# Patient Record
Sex: Male | Born: 1985 | Race: Black or African American | Hispanic: No | Marital: Single | Smoking: Current every day smoker
Health system: Southern US, Community
[De-identification: ages and names within clinical notes are randomized; demographics above are authoritative.]

## PROBLEM LIST (undated history)

## (undated) DIAGNOSIS — I10 Essential (primary) hypertension: Secondary | ICD-10-CM

---

## 2005-09-30 ENCOUNTER — Emergency Department (HOSPITAL_COMMUNITY): Admission: EM | Admit: 2005-09-30 | Discharge: 2005-09-30 | Payer: Self-pay | Admitting: Emergency Medicine

## 2009-06-30 ENCOUNTER — Emergency Department (HOSPITAL_COMMUNITY): Admission: EM | Admit: 2009-06-30 | Discharge: 2009-06-30 | Payer: Self-pay | Admitting: Emergency Medicine

## 2011-01-03 LAB — URINALYSIS, ROUTINE W REFLEX MICROSCOPIC
Bilirubin Urine: NEGATIVE
Glucose, UA: NEGATIVE mg/dL
Hgb urine dipstick: NEGATIVE
Specific Gravity, Urine: 1.021 (ref 1.005–1.030)
pH: 7.5 (ref 5.0–8.0)

## 2011-01-03 LAB — POCT I-STAT, CHEM 8
Chloride: 104 mEq/L (ref 96–112)
Glucose, Bld: 93 mg/dL (ref 70–99)
Hemoglobin: 17 g/dL (ref 13.0–17.0)
Potassium: 3.9 mEq/L (ref 3.5–5.1)
TCO2: 27 mmol/L (ref 0–100)

## 2011-01-03 LAB — CK TOTAL AND CKMB (NOT AT ARMC)
CK, MB: 1.3 ng/mL (ref 0.3–4.0)
Relative Index: 0.4 (ref 0.0–2.5)
Total CK: 311 U/L — ABNORMAL HIGH (ref 7–232)

## 2011-01-03 LAB — URINE MICROSCOPIC-ADD ON

## 2014-03-22 ENCOUNTER — Encounter (HOSPITAL_COMMUNITY): Payer: Self-pay | Admitting: Emergency Medicine

## 2014-03-22 ENCOUNTER — Other Ambulatory Visit: Payer: Self-pay

## 2014-03-22 ENCOUNTER — Emergency Department (HOSPITAL_COMMUNITY)
Admission: EM | Admit: 2014-03-22 | Discharge: 2014-03-22 | Disposition: A | Discharge status: Home / Self Care | Attending: Emergency Medicine | Admitting: Emergency Medicine

## 2014-03-22 ENCOUNTER — Emergency Department (HOSPITAL_COMMUNITY)

## 2014-03-22 DIAGNOSIS — R071 Chest pain on breathing: Secondary | ICD-10-CM | POA: Insufficient documentation

## 2014-03-22 DIAGNOSIS — I10 Essential (primary) hypertension: Secondary | ICD-10-CM | POA: Insufficient documentation

## 2014-03-22 DIAGNOSIS — R0789 Other chest pain: Secondary | ICD-10-CM

## 2014-03-22 DIAGNOSIS — R079 Chest pain, unspecified: Secondary | ICD-10-CM | POA: Diagnosis present

## 2014-03-22 HISTORY — DX: Essential (primary) hypertension: I10

## 2014-03-22 LAB — BASIC METABOLIC PANEL
BUN: 12 mg/dL (ref 6–23)
CHLORIDE: 104 meq/L (ref 96–112)
CO2: 29 meq/L (ref 19–32)
CREATININE: 1.23 mg/dL (ref 0.50–1.35)
Calcium: 9.5 mg/dL (ref 8.4–10.5)
GFR calc Af Amer: >90 mL/min (ref >90)
GFR calc non Af Amer: 78 mL/min — ABNORMAL LOW (ref >90)
Glucose, Bld: 101 mg/dL — ABNORMAL HIGH (ref 70–99)
Potassium: 4.2 mEq/L (ref 3.7–5.3)
Sodium: 143 mEq/L (ref 137–147)

## 2014-03-22 LAB — CBC
HCT: 42.7 % (ref 39.0–52.0)
HEMOGLOBIN: 14.5 g/dL (ref 13.0–17.0)
MCH: 27.5 pg (ref 26.0–34.0)
MCHC: 34 g/dL (ref 30.0–36.0)
MCV: 81 fL (ref 78.0–100.0)
Platelets: 270 10*3/uL (ref 150–400)
RBC: 5.27 MIL/uL (ref 4.22–5.81)
RDW: 13 % (ref 11.5–15.5)
WBC: 6.1 10*3/uL (ref 4.0–10.5)

## 2014-03-22 LAB — RAPID URINE DRUG SCREEN, HOSP PERFORMED
Amphetamines: NOT DETECTED
BARBITURATES: NOT DETECTED
BENZODIAZEPINES: NOT DETECTED
COCAINE: NOT DETECTED
OPIATES: NOT DETECTED
TETRAHYDROCANNABINOL: NOT DETECTED

## 2014-03-22 LAB — TROPONIN I: Troponin I: <0.3 ng/mL (ref <0.30)

## 2014-03-22 LAB — D-DIMER, QUANTITATIVE (NOT AT ARMC)

## 2014-03-22 MED ORDER — ACETAMINOPHEN 500 MG PO TABS
1000.0000 mg | ORAL_TABLET | Freq: Once | ORAL | Status: AC
  Administered 2014-03-22: 1000 mg via ORAL
  Filled 2014-03-22: qty 2

## 2014-03-22 MED ORDER — IBUPROFEN 600 MG PO TABS: 600.0000 mg | ORAL_TABLET | Freq: Four times a day (QID) | ORAL | Status: DC | PRN

## 2014-03-22 MED ORDER — KETOROLAC TROMETHAMINE 30 MG/ML IJ SOLN
30.0000 mg | Freq: Once | INTRAMUSCULAR | Status: AC
  Administered 2014-03-22: 30 mg via INTRAVENOUS
  Filled 2014-03-22: qty 1

## 2014-03-22 NOTE — ED Notes (Signed)
Patient transported to X-ray 

## 2014-03-22 NOTE — ED Notes (Signed)
Pt given urinal to obtain specimen.

## 2014-03-22 NOTE — ED Provider Notes (Signed)
CSN: [id number]     Arrival date & time 03/22/14  0750 History   First MD Initiated Contact with Patient 03/22/14 0801     Chief Complaint  Patient presents with  . Chest Pain     (Consider location/radiation/quality/duration/timing/severity/associated sxs/prior Treatment) HPI Comments: Bruce Becker is a 28 y.o. Male [facility] presenting with sudden onset of right sided chest pain which is worsened with palpation and deep inspiration.  It has been persistent all night and improved "a little" after receiving ASA and ntg per ems prior to arrival, although now endorses headache.  He was watching tv when the pain began.  He denies nausea, vomiting, sob, diaphoresis.  He has not had cough or hemoptysis, no recent prolonged periods of inactivity and denies pain or swelling in his legs.  He denies cigarette use, substance abuse and family history is negative for cardiac history at early age.  His father has had problems with blood clots; he believes it started with an injury, but had multiple problems with these and has been on blood thinners for a long time.       The history is provided by the patient.    Past Medical History  Diagnosis Date  . Hypertension    History reviewed. No pertinent past surgical history. No family history on file. History  Substance Use Topics  . Smoking status: Never Smoker   . Smokeless tobacco: Not on file  . Alcohol Use: No    Review of Systems  Constitutional: Negative for fever.  HENT: Negative for congestion and sore throat.   Eyes: Negative.   Respiratory: Negative for cough, chest tightness, shortness of breath, wheezing and stridor.   Cardiovascular: Positive for chest pain. Negative for leg swelling.  Gastrointestinal: Negative for nausea and abdominal pain.  Genitourinary: Negative.   Musculoskeletal: Negative for arthralgias, joint swelling and neck pain.  Skin: Negative.  Negative for rash and wound.   Neurological: Negative for dizziness, weakness, light-headedness, numbness and headaches.  Psychiatric/Behavioral: Negative.       Allergies  Review of patient's allergies indicates no known allergies.  Home Medications   Prior to Admission medications   Medication Sig Start Date End Date Taking? Authorizing Provider  ibuprofen (ADVIL,MOTRIN) 600 MG tablet Take 1 tablet (600 mg total) by mouth every 6 (six) hours as needed for moderate pain. 03/22/14   Burgess AmorJulie Idol, PA-C   BP 128/93  Pulse 58  Temp(Src) 98 F (36.7 C)  Resp 13  SpO2 100% Physical Exam  Nursing note and vitals reviewed. Constitutional: He appears well-developed and well-nourished.  HENT:  Head: Normocephalic and atraumatic.  Eyes: Conjunctivae are normal.  Neck: Normal range of motion.  Cardiovascular: Normal rate, regular rhythm, normal heart sounds and intact distal pulses.   Pulmonary/Chest: Effort normal and breath sounds normal. He has no decreased breath sounds. He has no wheezes. He has no rhonchi. He has no rales. He exhibits tenderness.  Mild tenderness across right lower ribs.  No edema, no visible trauma.  Abdominal: Soft. Bowel sounds are normal. There is no tenderness.  Musculoskeletal: Normal range of motion.  Neurological: He is alert.  Skin: Skin is warm and dry.  Psychiatric: He has a normal mood and affect.    ED Course  Procedures (including critical care time) Labs Review Labs Reviewed  BASIC METABOLIC PANEL - Abnormal; Notable for the following:    Glucose, Bld 101 (*)    GFR calc non Af Amer 78 (*)  All other components within normal limits  CBC  TROPONIN I  URINE RAPID DRUG SCREEN (HOSP PERFORMED)  D-DIMER, QUANTITATIVE    Imaging Review Dg Chest 2 View  03/22/2014   CLINICAL DATA:  Right-sided chest pain  EXAM: CHEST  2 VIEW  COMPARISON:  06/30/2009  FINDINGS: The heart size and mediastinal contours are within normal limits. Both lungs are clear. The visualized skeletal  structures are unremarkable.  IMPRESSION: Normal chest radiographs.   Electronically Signed   By: Amie Portlandavid  Ormond M.D.   On: 03/22/2014 08:04     EKG Interpretation None        Date: 03/22/2014  Rate: 61  Rhythm: normal sinus rhythm  QRS Axis: normal  Intervals: QT prolonged  ST/T Wave abnormalities: normal  Conduction Disutrbances:none  Narrative Interpretation:   Old EKG Reviewed: none available    MDM   Final diagnoses:  Right-sided chest wall pain    Patients labs and/or radiological studies were viewed and considered during the medical decision making and disposition process. Pt with reproducible chest pain without dyspnea.  Labs including d dimer negative.  Negative troponin for sx that started more than 8 hours ago.  Pt prescribed ibuprofen, advised prn f/u if sx persist or worsen.     Burgess AmorJulie Idol, PA-C 03/22/14 [phone]

## 2014-03-22 NOTE — Discharge Instructions (Signed)
Chest Wall Pain Chest wall pain is pain felt in or around the chest bones and muscles. It may take up to 6 weeks to get better. It may take longer if you are active. Chest wall pain can happen on its own. Other times, things like germs, injury, coughing, or exercise can cause the pain. HOME CARE   Avoid activities that make you tired or cause pain. Try not to use your chest, belly (abdominal), or side muscles. Do not use heavy weights.  Put ice on the sore area.  Put ice in a plastic bag.  Place a towel between your skin and the bag.  Leave the ice on for 15-20 minutes for the first 2 days.  Only take medicine as told by your doctor. GET HELP RIGHT AWAY IF:   You have more pain or are very uncomfortable.  You have a fever.  Your chest pain gets worse.  You have new problems.  You feel sick to your stomach (nauseous) or throw up (vomit).  You start to sweat or feel lightheaded.  You have a cough with mucus (phlegm).  You cough up blood. MAKE SURE YOU:   Understand these instructions.  Will watch your condition.  Will get help right away if you are not doing well or get worse. Document Released: 03/04/2008 Document Revised: 12/09/2011 Document Reviewed: 05/13/2011 [facility] Patient Information 2015 [place]. This information is not intended to replace advice given to you by your health care provider. Make sure you discuss any questions you have with your health care provider.   Your lab tests, chest xray and ekg are normal.  We have assessed you for your pain being due to a cardiac source (heart attack), infection (pneumonia) or a blood clot (pulmonary embolism) - all are negative for these conditions.  I suspect this is chest wall pain (muscle, rib or cartilage inflammation) which should get better with rest and time.  Use the ibuprofen every 6 hours.  Apply an ice pack to your right chest wall for 15 minutes 4 times daily(every 6 hours) for the next 2 days.

## 2014-03-22 NOTE — ED Provider Notes (Signed)
Medical screening examination/treatment/procedure(s) were performed by non-physician practitioner and as supervising physician I was immediately available for consultation/collaboration.   EKG Interpretation None        Benny LennertJoseph L Zammit, MD 03/22/14 1354

## 2014-03-22 NOTE — ED Notes (Addendum)
[facility], reports chest pain to right side of chest since last night, described as sharp. Pt given aspirin 324mg  and nitro SL x 2. Pain 5/10 on arrival to ED. BG 160. Hx of HTN. Pain worse when he takes a deep breath.

## 2014-06-11 ENCOUNTER — Emergency Department (HOSPITAL_COMMUNITY)

## 2014-06-11 ENCOUNTER — Emergency Department (HOSPITAL_COMMUNITY)
Admission: EM | Admit: 2014-06-11 | Discharge: 2014-06-11 | Disposition: A | Discharge status: Home / Self Care | Attending: Emergency Medicine | Admitting: Emergency Medicine

## 2014-06-11 ENCOUNTER — Encounter (HOSPITAL_COMMUNITY): Payer: Self-pay | Admitting: Emergency Medicine

## 2014-06-11 DIAGNOSIS — W208XXA Other cause of strike by thrown, projected or falling object, initial encounter: Secondary | ICD-10-CM | POA: Insufficient documentation

## 2014-06-11 DIAGNOSIS — S6990XA Unspecified injury of unspecified wrist, hand and finger(s), initial encounter: Secondary | ICD-10-CM | POA: Diagnosis present

## 2014-06-11 DIAGNOSIS — S62101A Fracture of unspecified carpal bone, right wrist, initial encounter for closed fracture: Secondary | ICD-10-CM

## 2014-06-11 DIAGNOSIS — Y9389 Activity, other specified: Secondary | ICD-10-CM | POA: Insufficient documentation

## 2014-06-11 DIAGNOSIS — I10 Essential (primary) hypertension: Secondary | ICD-10-CM | POA: Insufficient documentation

## 2014-06-11 DIAGNOSIS — S62109A Fracture of unspecified carpal bone, unspecified wrist, initial encounter for closed fracture: Secondary | ICD-10-CM | POA: Insufficient documentation

## 2014-06-11 DIAGNOSIS — Y929 Unspecified place or not applicable: Secondary | ICD-10-CM | POA: Diagnosis not present

## 2014-06-11 MED ORDER — IBUPROFEN 800 MG PO TABS
800.0000 mg | ORAL_TABLET | Freq: Once | ORAL | Status: AC
  Administered 2014-06-11: 800 mg via ORAL
  Filled 2014-06-11: qty 1

## 2014-06-11 MED ORDER — IBUPROFEN 800 MG PO TABS: 800.0000 mg | ORAL_TABLET | Freq: Three times a day (TID) | ORAL | Status: DC

## 2014-06-11 NOTE — ED Notes (Signed)
Pt had about 50 lbs of barbell drop on right hand/ wrist today, states unable to grip

## 2014-06-11 NOTE — ED Notes (Signed)
Pt c/o pain to the right hand. States a weight was dropped on his hand. Some redness, swelling noted. Pt is able to move fingers and has positive sensation.

## 2014-06-11 NOTE — Discharge Instructions (Signed)
Wrist Fracture A wrist fracture is a break or crack in one of the bones of your wrist. Your wrist is made up of eight small bones at the palm of your hand (carpal bones) and two long bones that make up your forearm (radius and ulna).  CAUSES   A direct blow to the wrist.  Falling on an outstretched hand.  Trauma, such as a car accident or a fall. RISK FACTORS Risk factors for wrist fracture include:   Participating in contact and high-risk sports, such as skiing, biking, and ice skating.  Taking steroid medicines.  Smoking.  Being male.  Being Caucasian.  Drinking more than three alcoholic beverages per day.  Having low or lowered bone density (osteoporosis or osteopenia).  Age. Older adults have decreased bone density.  Women who have had menopause.  History of previous fractures. SIGNS AND SYMPTOMS Symptoms of wrist fractures include tenderness, bruising, and inflammation. Additionally, the wrist may hang in an odd position or appear deformed.  DIAGNOSIS Diagnosis may include:  Physical exam.  X-ray. TREATMENT Treatment depends on many factors, including the nature and location of the fracture, your age, and your activity level. Treatment for wrist fracture can be nonsurgical or surgical.  Nonsurgical Treatment A plaster cast or splint may be applied to your wrist if the bone is in a good position. If the fracture is not in good position, it may be necessary for your health care provider to realign it before applying a splint or cast. Usually, a cast or splint will be worn for several weeks.  Surgical Treatment Sometimes the position of the bone is so far out of place that surgery is required to apply a device to hold it together as it heals. Depending on the fracture, there are a number of options for holding the bone in place while it heals, such as a cast and metal pins.  HOME CARE INSTRUCTIONS  Keep your injured wrist elevated and move your fingers as much as  possible.  Do not put pressure on any part of your cast or splint. It may break.   Use a plastic bag to protect your cast or splint from water while bathing or showering. Do not lower your cast or splint into water.  Take medicines only as directed by your health care provider.  Keep your cast or splint clean and dry. If it becomes wet, damaged, or suddenly feels too tight, contact your health care provider right away.  Do not use any tobacco products including cigarettes, chewing tobacco, or electronic cigarettes. Tobacco can delay bone healing. If you need help quitting, ask your health care provider.  Keep all follow-up visits as directed by your health care provider. This is important.  Ask your health care provider if you should take supplements of calcium and vitamins C and D to promote bone healing. SEEK MEDICAL CARE IF:   Your cast or splint is damaged, breaks, or gets wet.  You have a fever.  You have chills.  You have continued severe pain or more swelling than you did before the cast was put on. SEEK IMMEDIATE MEDICAL CARE IF:   Your hand or fingernails on the injured arm turn blue or gray, or feel cold or numb.  You have decreased feeling in the fingers of your injured arm. MAKE SURE YOU:  Understand these instructions.  Will watch your condition.  Will get help right away if you are not doing well or get worse. Document Released: 06/26/2005 Document Revised:   01/31/2014 Document Reviewed: 10/04/2011 [employer] Patient Information 2015 [employer]. This information is not intended to replace advice given to you by your health care provider. Make sure you discuss any questions you have with your health care provider.  

## 2014-06-13 NOTE — ED Provider Notes (Addendum)
CSN: [id number]     Arrival date & time 06/11/14  1850 History   First MD Initiated Contact with Patient 06/11/14 1919     Chief Complaint  Patient presents with  . Hand Injury     (Consider location/radiation/quality/duration/timing/severity/associated sxs/prior Treatment) HPI   Bruce Becker is a 28 y.o. male who is a inmate at a local correctional facility, presents to the Emergency Department complaining of pain and swelling to the right hand and wrist after a direct blow with barbell weight was dropped on his hand.  He reports pain with any movement of the wrist.  He denies numbness or weakness of the hand, previous fx, or proximal tenderness.  Pt has applied ice to the injury with mild relief.     Past Medical History  Diagnosis Date  . Hypertension    History reviewed. No pertinent past surgical history. No family history on file. History  Substance Use Topics  . Smoking status: Never Smoker   . Smokeless tobacco: Not on file  . Alcohol Use: No    Review of Systems  Constitutional: Negative for fever and chills.  Genitourinary: Negative for dysuria and difficulty urinating.  Musculoskeletal: Positive for arthralgias and joint swelling.  Skin: Negative for color change and wound.  Neurological: Negative for weakness and numbness.  All other systems reviewed and are negative.     Allergies  Review of patient's allergies indicates no known allergies.  Home Medications   Prior to Admission medications   Medication Sig Start Date End Date Taking? Authorizing Provider  ibuprofen (ADVIL,MOTRIN) 600 MG tablet Take 1 tablet (600 mg total) by mouth every 6 (six) hours as needed for moderate pain. 03/22/14  Yes Burgess Amor, PA-C  ibuprofen (ADVIL,MOTRIN) 800 MG tablet Take 1 tablet (800 mg total) by mouth 3 (three) times daily. Take with food 06/11/14   Bruce Bezanson L. Gennett Garcia, PA-C   BP 170/105  Pulse 66  Temp(Src) 98.5 F (36.9 C) (Oral)  Resp 20  Ht  (1.778 m)   Wt 222 lb (100.699 kg)  BMI 31.85 kg/m2  SpO2 100% Physical Exam  Nursing note and vitals reviewed. Constitutional: He is oriented to person, place, and time. He appears well-developed and well-nourished. No distress.  HENT:  Head: Normocephalic and atraumatic.  Cardiovascular: Normal rate, regular rhythm, normal heart sounds and intact distal pulses.   No murmur heard. Pulmonary/Chest: Effort normal and breath sounds normal. No respiratory distress.  Musculoskeletal: He exhibits edema and tenderness.  Mild to moderate STS of the dorsal right wrist and hand  Radial pulse is brisk, distal sensation intact.  CR< 2 sec.  No bruising or bony deformity.  Pain reproduced with flexion of the wrist. Compartments are soft.  Neurological: He is alert and oriented to person, place, and time. He exhibits normal muscle tone. Coordination normal.  Skin: Skin is warm and dry.    ED Course  Procedures (including critical care time) Labs Review Labs Reviewed - No data to display  Imaging Review Dg Wrist Complete Right  06/11/2014   CLINICAL DATA:  RIGHT hand and wrist pain.  EXAM: RIGHT WRIST - COMPLETE 3+ VIEW  COMPARISON:  Hand films today.  FINDINGS: Tiny chip of bone at the dorsal aspect of the carpometacarpal junction. Donor site unclear. Overlying soft tissue swelling. No other fracture. The alignment is anatomic. Carpal spacing appears normal. Scaphoid bone intact.  IMPRESSION: Tiny dorsal chip fracture at the carpometacarpal junction only seen on the lateral view.  Electronically Signed   By: Andreas Newport M.D.   On: 06/11/2014 19:34   Dg Hand Complete Right  06/11/2014   CLINICAL DATA:  Right wrist and hand pain  EXAM: RIGHT HAND - COMPLETE 3+ VIEW  COMPARISON:  None.  FINDINGS: There is no evidence of fracture or dislocation. There is no evidence of arthropathy or other focal bone abnormality. Soft tissue swelling overlying the fifth metacarpal bone identified.  IMPRESSION: 1. No acute bone  abnormalities. 2. Soft tissue swelling.   Electronically Signed   By: Signa Kell M.D.   On: 06/11/2014 19:32     EKG Interpretation None      MDM   Final diagnoses:  Wrist fracture, closed, right, initial encounter    Patient has clinical symptoms consistent with chip fracture of one of the carpal bones of the right wrist although donor site is unclear from imaging, will treat as occult fracture with splint and pt advised to arrange orthopedic f/u   Sugar tong splint applied and sling given, pain improved , remains NV intact.  Pt advised to elevate, ice and close f/u with orthopedics within the correctional facility or with Dr. Romeo Apple.  Rx for ibuprofen      Demitra Danley L. Adryel Wortmann, PA-C 06/13/14 2014  Bruce Charles L. Trisha Mangle, PA-C 06/24/14 2044

## 2014-06-14 NOTE — ED Provider Notes (Signed)
Medical screening examination/treatment/procedure(s) were performed by non-physician practitioner and as supervising physician I was immediately available for consultation/collaboration.   EKG Interpretation None        Hawken Bielby L Haile Bosler, MD 06/14/14 1357 

## 2014-07-05 NOTE — ED Provider Notes (Signed)
Medical screening examination/treatment/procedure(s) were performed by non-physician practitioner and as supervising physician I was immediately available for consultation/collaboration.   EKG Interpretation None        Benny LennertJoseph L Andrue Dini, MD 07/05/14 1504

## 2015-08-11 IMAGING — CR DG HAND COMPLETE 3+V*R*
3 series · 3 of 3 positions shown · non-contrast
Comparison: None.

CLINICAL DATA: Right wrist and hand pain

EXAM:
RIGHT HAND - COMPLETE 3+ VIEW

[view not recorded (1 of 3)]
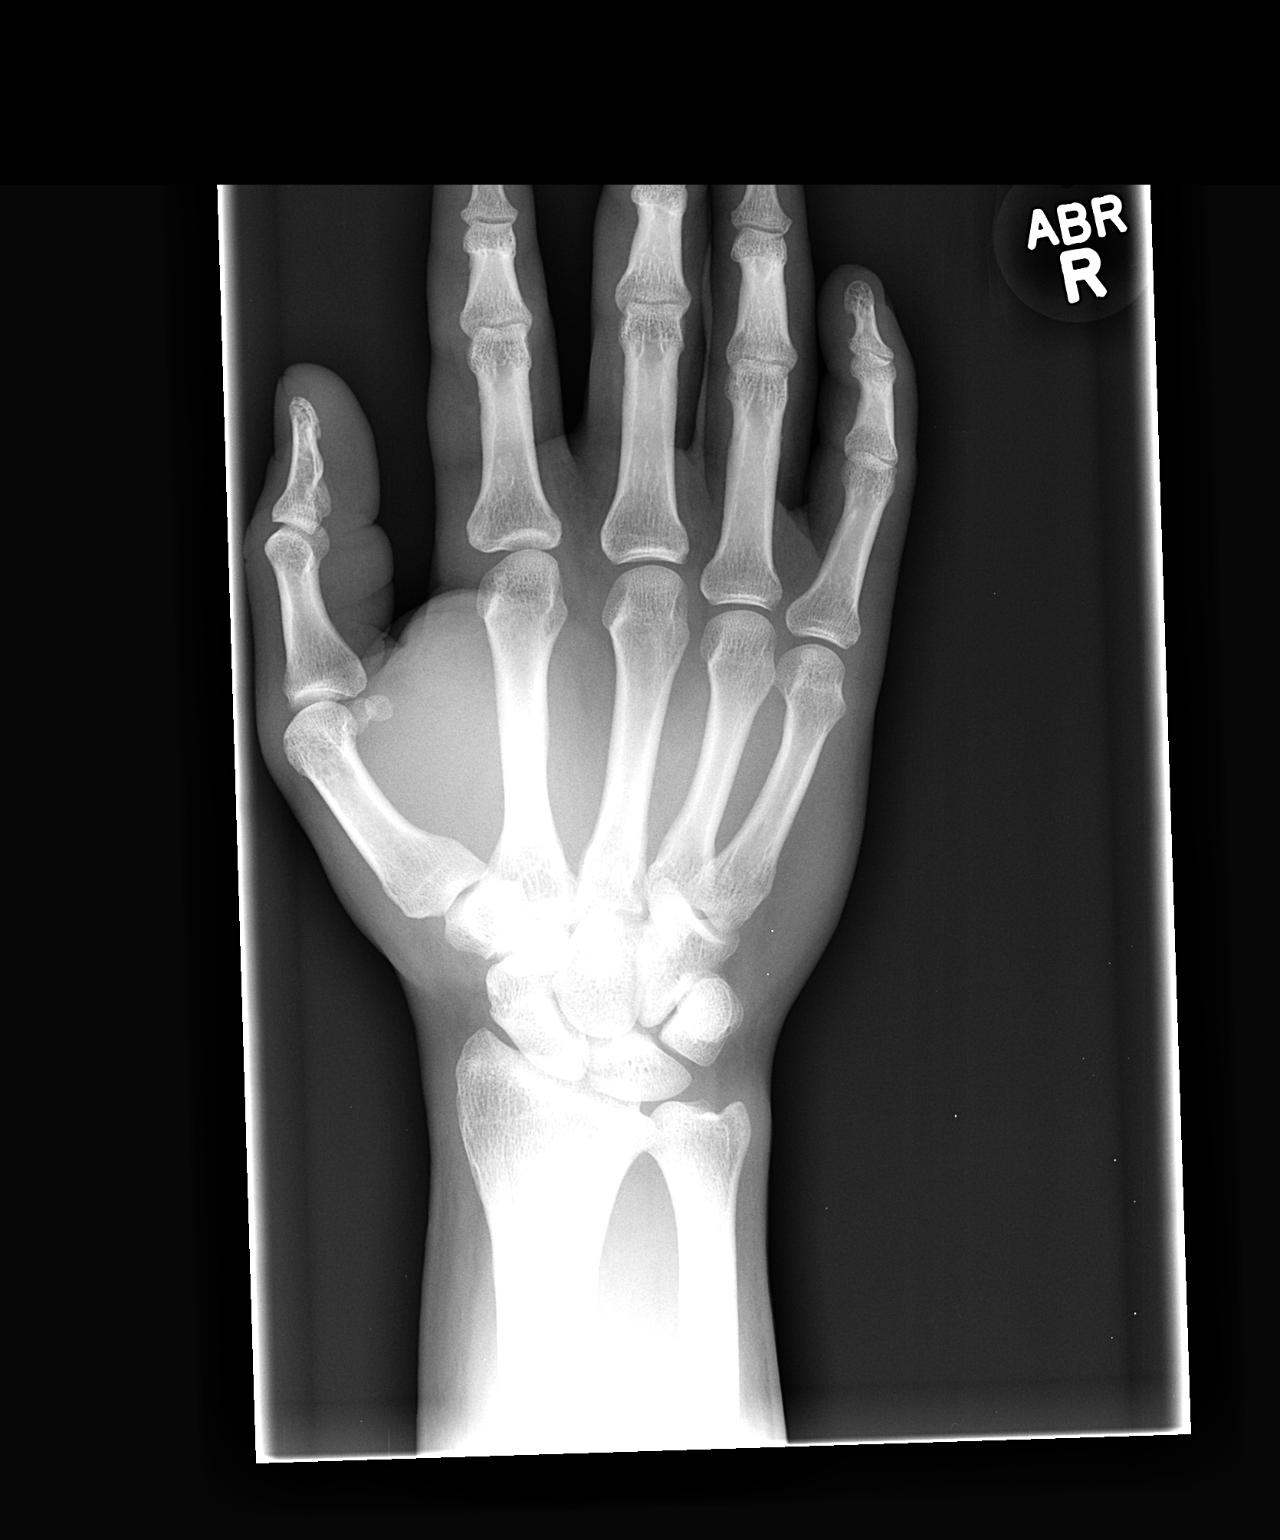

[view not recorded (2 of 3)]
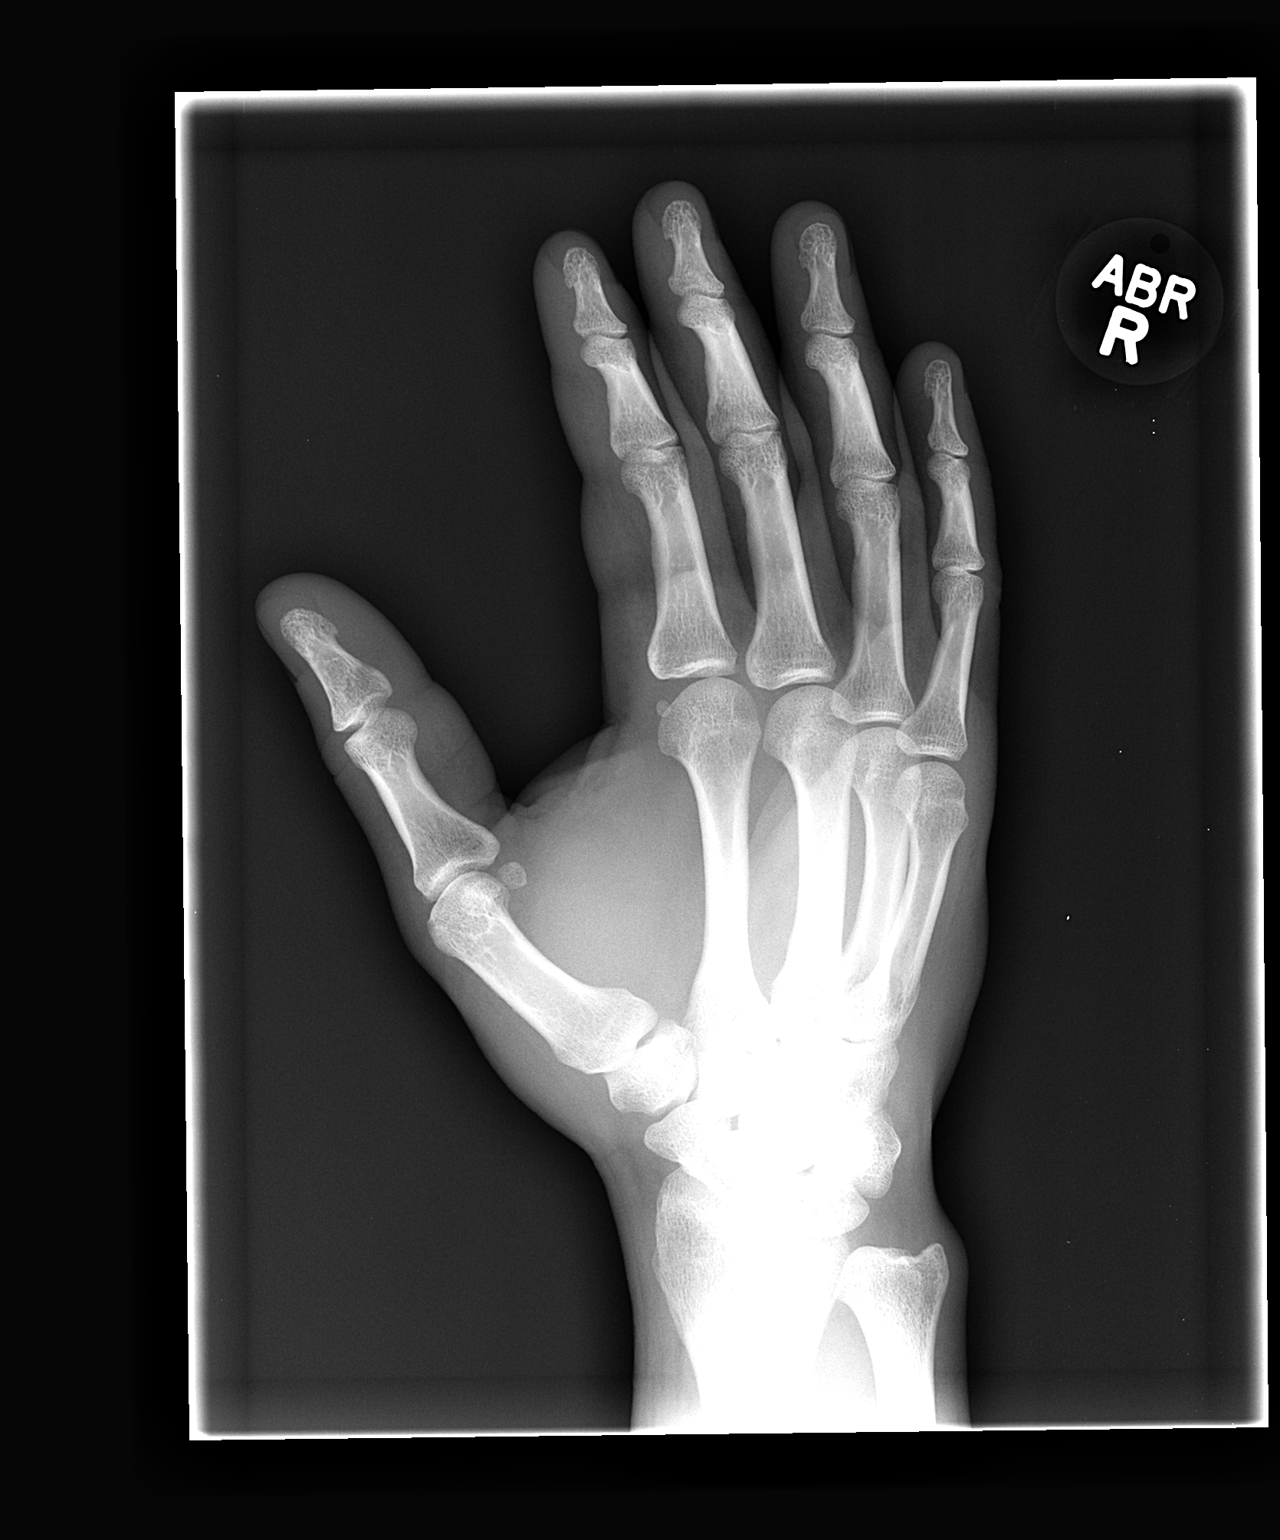

[view not recorded (3 of 3)]
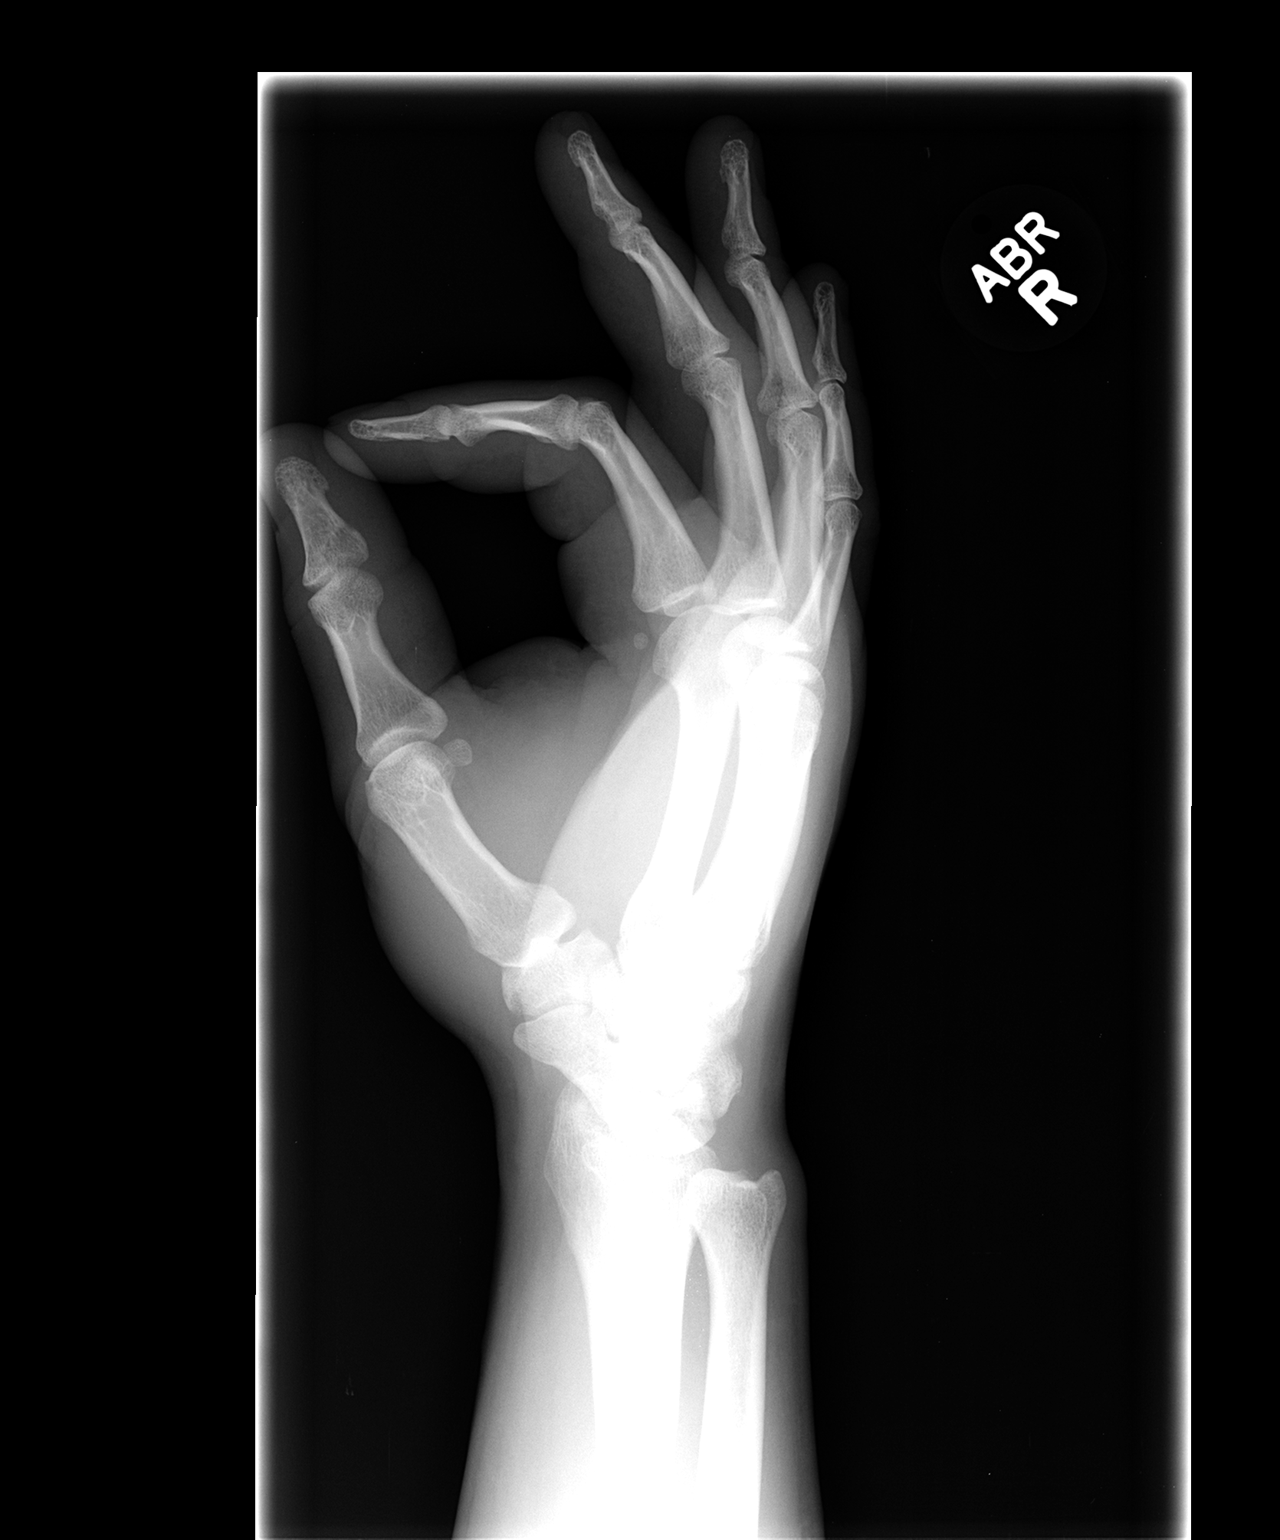

[3 of 3 positions shown; findings below may reference images not displayed]

FINDINGS: There is no evidence of fracture or dislocation. There is no
evidence of arthropathy or other focal bone abnormality. Soft tissue
swelling overlying the fifth metacarpal bone identified.
IMPRESSION: 1. No acute bone abnormalities.
2. Soft tissue swelling.

## 2016-02-01 ENCOUNTER — Emergency Department (HOSPITAL_COMMUNITY)
Admission: EM | Admit: 2016-02-01 | Discharge: 2016-02-01 | Disposition: A | Discharge status: Home / Self Care | Attending: Emergency Medicine | Admitting: Emergency Medicine

## 2016-02-01 ENCOUNTER — Encounter (HOSPITAL_COMMUNITY): Payer: Self-pay | Admitting: Emergency Medicine

## 2016-02-01 DIAGNOSIS — R319 Hematuria, unspecified: Secondary | ICD-10-CM

## 2016-02-01 DIAGNOSIS — I10 Essential (primary) hypertension: Secondary | ICD-10-CM | POA: Insufficient documentation

## 2016-02-01 DIAGNOSIS — R361 Hematospermia: Secondary | ICD-10-CM

## 2016-02-01 LAB — URINALYSIS, ROUTINE W REFLEX MICROSCOPIC
Bilirubin Urine: NEGATIVE
Glucose, UA: NEGATIVE mg/dL
Ketones, ur: NEGATIVE mg/dL
Leukocytes, UA: NEGATIVE
NITRITE: NEGATIVE
Protein, ur: NEGATIVE mg/dL
SPECIFIC GRAVITY, URINE: 1.025 (ref 1.005–1.030)
pH: 6 (ref 5.0–8.0)

## 2016-02-01 LAB — URINE MICROSCOPIC-ADD ON

## 2016-02-01 NOTE — Discharge Instructions (Signed)

## 2016-02-01 NOTE — ED Notes (Signed)
Pt c/o blood discharge from penis after urination, states urine is clear with drops of blood emitted from penis post-voiding, as well as blood in semen during ejaculation. No pain or other discharge, no penile lesions.

## 2016-02-01 NOTE — ED Provider Notes (Signed)
CSN: [id number]     Arrival date & time 02/01/16  [id number] History   First MD Initiated Contact with Patient 02/01/16 [phone]     Chief Complaint  Patient presents with  . Hematuria     (Consider location/radiation/quality/duration/timing/severity/associated sxs/prior Treatment) Patient is a 30 y.o. male presenting with hematuria.  Hematuria Chronicity: 2 days. Episode frequency: last night in semen. Pertinent negatives include no chest pain, no abdominal pain, no headaches and no shortness of breath. Nothing aggravates the symptoms. Nothing relieves the symptoms. He has tried nothing for the symptoms.   Hematuria beginning yesterday, hematospermia last night. Never had this before. No penile discharge, no dysuria, no concern for STD exposure. Sexually active with condoms. No rectal pain, no hx of rectal penetration, no fevers, no back pain, no abd pain.   Past Medical History  Diagnosis Date  . Hypertension    History reviewed. No pertinent past surgical history. History reviewed. No pertinent family history. Social History  Substance Use Topics  . Smoking status: Never Smoker   . Smokeless tobacco: None  . Alcohol Use: No    Review of Systems  Constitutional: Negative for fever.  HENT: Negative for sore throat.   Eyes: Negative for visual disturbance.  Respiratory: Negative for shortness of breath.   Cardiovascular: Negative for chest pain.  Gastrointestinal: Negative for abdominal pain.  Genitourinary: Positive for hematuria (and hematospermia). Negative for dysuria, flank pain, discharge, penile swelling, scrotal swelling, difficulty urinating and penile pain.  Musculoskeletal: Negative for back pain and neck stiffness.  Skin: Negative for rash.  Neurological: Negative for syncope and headaches.      Allergies  Review of patient's allergies indicates no known allergies.  Home Medications   Prior to Admission medications   Not on File   BP 156/105 mmHg  Pulse 60   Temp(Src) 98 F (36.7 C) (Oral)  Resp 16  SpO2 98% Physical Exam  Constitutional: He is oriented to person, place, and time. He appears well-developed and well-nourished. No distress.  HENT:  Head: Normocephalic and atraumatic.  Eyes: Conjunctivae and EOM are normal.  Neck: Normal range of motion.  Cardiovascular: Normal rate, regular rhythm, normal heart sounds and intact distal pulses.  Exam reveals no gallop and no friction rub.   No murmur heard. Pulmonary/Chest: Effort normal and breath sounds normal. No respiratory distress. He has no wheezes. He has no rales.  Abdominal: Soft. He exhibits no distension. There is no tenderness. There is no guarding.  Genitourinary: Rectum normal, prostate normal, testes normal and penis normal. Prostate is not tender. Right testis shows no tenderness. Left testis shows no tenderness. No penile erythema or penile tenderness. No discharge found.  Musculoskeletal: He exhibits no edema.  Neurological: He is alert and oriented to person, place, and time.  Skin: Skin is warm and dry. He is not diaphoretic.  Nursing note and vitals reviewed.   ED Course  Procedures (including critical care time) Labs Review Labs Reviewed  URINALYSIS, ROUTINE W REFLEX MICROSCOPIC (NOT AT [facility]) - Abnormal; Notable for the following:    Hgb urine dipstick SMALL (*)    All other components within normal limits  URINE MICROSCOPIC-ADD ON - Abnormal; Notable for the following:    Squamous Epithelial / LPF 0-5 (*)    Bacteria, UA RARE (*)    All other components within normal limits  GC/CHLAMYDIA PROBE AMP ([facility]) NOT AT [facility]    Imaging Review No results found. I have personally reviewed and evaluated these images  and lab results as part of my medical decision-making.   EKG Interpretation None      MDM   Final diagnoses:  Hematuria  Hematospermia   30 year old meal and a significant medical history presents with concern for hematuria and hematospermia.   Patient without other symptoms, doubt renal colic, no prostatic tenderness, no symptoms of urethritis.  Offered testing and empiric treatment for gonorrhea and chlamydia, however patient declines empiric therapies.  Urinalysis shows no signs of infection. Discussed that sentence should resolve spontaneously, and if he develops new concerning symptoms like abdominal pain, pain with urination, back pain, fevers or rectal pain he should return to ED.  Otherwise, provided patient with follow-up information for urology if symptoms persist. GC/Chlamydia sent and pending.  Alvira Monday, MD 02/01/16 2031

## 2016-02-02 LAB — GC/CHLAMYDIA PROBE AMP (~~LOC~~) NOT AT ARMC
CHLAMYDIA, DNA PROBE: NEGATIVE
Neisseria Gonorrhea: NEGATIVE

## 2016-05-20 ENCOUNTER — Emergency Department
Admission: EM | Admit: 2016-05-20 | Discharge: 2016-05-20 | Disposition: A | Discharge status: Home / Self Care | Attending: Emergency Medicine | Admitting: Emergency Medicine

## 2016-05-20 ENCOUNTER — Encounter: Payer: Self-pay | Admitting: Emergency Medicine

## 2016-05-20 ENCOUNTER — Emergency Department

## 2016-05-20 DIAGNOSIS — K2970 Gastritis, unspecified, without bleeding: Secondary | ICD-10-CM | POA: Insufficient documentation

## 2016-05-20 DIAGNOSIS — I10 Essential (primary) hypertension: Secondary | ICD-10-CM | POA: Insufficient documentation

## 2016-05-20 LAB — BASIC METABOLIC PANEL WITH GFR
Anion gap: 6 (ref 5–15)
BUN: 12 mg/dL (ref 6–20)
CO2: 26 mmol/L (ref 22–32)
Calcium: 9.3 mg/dL (ref 8.9–10.3)
Chloride: 104 mmol/L (ref 101–111)
Creatinine, Ser: 0.89 mg/dL (ref 0.61–1.24)
GFR calc Af Amer: >60 mL/min
GFR calc non Af Amer: >60 mL/min
Glucose, Bld: 104 mg/dL — ABNORMAL HIGH (ref 65–99)
Potassium: 3.5 mmol/L (ref 3.5–5.1)
Sodium: 136 mmol/L (ref 135–145)

## 2016-05-20 LAB — CBC
HCT: 43.9 % (ref 40.0–52.0)
Hemoglobin: 14.9 g/dL (ref 13.0–18.0)
MCH: 27.7 pg (ref 26.0–34.0)
MCHC: 34 g/dL (ref 32.0–36.0)
MCV: 81.5 fL (ref 80.0–100.0)
PLATELETS: 294 10*3/uL (ref 150–440)
RBC: 5.38 MIL/uL (ref 4.40–5.90)
RDW: 13.2 % (ref 11.5–14.5)
WBC: 10.6 10*3/uL (ref 3.8–10.6)

## 2016-05-20 LAB — TROPONIN I: Troponin I: <0.03 ng/mL

## 2016-05-20 MED ORDER — GI COCKTAIL ~~LOC~~
ORAL | Status: AC
  Filled 2016-05-20: qty 30

## 2016-05-20 MED ORDER — SUCRALFATE 1 G PO TABS: 1.0000 g | ORAL_TABLET | Freq: Four times a day (QID) | ORAL | 0 refills | Status: AC

## 2016-05-20 MED ORDER — RANITIDINE HCL 150 MG PO TABS: 150.0000 mg | ORAL_TABLET | Freq: Two times a day (BID) | ORAL | 1 refills | Status: AC

## 2016-05-20 MED ORDER — GI COCKTAIL ~~LOC~~
30.0000 mL | Freq: Once | ORAL | Status: AC
  Administered 2016-05-20: 30 mL via ORAL

## 2016-05-20 NOTE — ED Triage Notes (Signed)
Pt with generalized body aches and abd pain started about one week ago.

## 2016-05-20 NOTE — Discharge Instructions (Signed)
Please seek medical attention for any high fevers, chest pain, shortness of breath, change in behavior, persistent vomiting, bloody stool or any other new or concerning symptoms.  

## 2016-05-20 NOTE — ED Provider Notes (Signed)
[facility] Emergency Department Provider Note    ____________________________________________   I have reviewed the triage vital signs and the nursing notes.   HISTORY  Chief Complaint Right upper quadrant pain  History limited by: Not Limited   HPI Bruce Becker is a 30 y.o. male with history of high blood pressure, who presents to the emergency department today with primary complaint of right upper quadrant abdominal pain. The patient states that he's been having this pain intermittently for the past few months. However for the past week it has been more constant. Today he had the pain throughout the day and it was accompanied by nausea and vomiting. He feels like his appetite was decreased with this pain. Also feels increasingly weak. He denies that the pain is worse with food. He is not identified any eliciting or alleviating factors. He denies any fevers. Denies any significant change in defecation.   Past Medical History:  Diagnosis Date  . Hypertension     There are no active problems to display for this patient.   History reviewed. No pertinent surgical history.  Prior to Admission medications   Not on File    Allergies Review of patient's allergies indicates no known allergies.  No family history on file.  Social History Social History  Substance Use Topics  . Smoking status: Never Smoker  . Smokeless tobacco: Never Used  . Alcohol use No    Review of Systems  Constitutional: Negative for fever. Cardiovascular: Negative for chest pain. Respiratory: Negative for shortness of breath. Gastrointestinal: Positive for right upper quadrant abdominal pain Neurological: Negative for headaches, focal weakness or numbness.   10-point ROS otherwise negative.  ____________________________________________   PHYSICAL EXAM:  VITAL SIGNS: ED Triage Vitals  Enc Vitals Group     BP 05/20/16 1853 (!) 151/91     Pulse Rate 05/20/16  1853 78     Resp 05/20/16 1853 18     Temp 05/20/16 1853 98.4 F (36.9 C)     Temp Source 05/20/16 1853 Oral     SpO2 05/20/16 1853 98 %     Weight 05/20/16 1854 220 lb (99.8 kg)     Height 05/20/16 1854 5\' 11"  (1.803 m)     Head Circumference --      Peak Flow --      Pain Score 05/20/16 1856 10   Constitutional: Alert and oriented. Well appearing and in no distress. Eyes: Conjunctivae are normal. PERRL. Normal extraocular movements. ENT   Head: Normocephalic and atraumatic.   Nose: No congestion/rhinnorhea.   Mouth/Throat: Mucous membranes are moist.   Neck: No stridor. Hematological/Lymphatic/Immunilogical: No cervical lymphadenopathy. Cardiovascular: Normal rate, regular rhythm.  No murmurs, rubs, or gallops. Respiratory: Normal respiratory effort without tachypnea nor retractions. Breath sounds are clear and equal bilaterally. No wheezes/rales/rhonchi. Gastrointestinal: Soft and Very minimally tender to palpation in the epigastric region. No distention. There is no CVA tenderness. Genitourinary: Deferred Musculoskeletal: Normal range of motion in all extremities. No joint effusions.  No lower extremity tenderness nor edema. Neurologic:  Normal speech and language. No gross focal neurologic deficits are appreciated.  Skin:  Skin is warm, dry and intact. No rash noted. Psychiatric: Mood and affect are normal. Speech and behavior are normal. Patient exhibits appropriate insight and judgment.  ____________________________________________    LABS (pertinent positives/negatives)  Labs Reviewed  BASIC METABOLIC PANEL - Abnormal; Notable for the following:       Result Value   Glucose, Bld 104 (*)  All other components within normal limits  CBC  TROPONIN I     ____________________________________________   EKG  I, Phineas SemenGraydon Joon Pohle, attending physician, personally viewed and interpreted this EKG  EKG Time: 1903 Rate: 62 Rhythm: normal sinus rhythm Axis:  normal Intervals: qtc 408 QRS: narrow ST changes: no st elevation Impression: normal ekg   ____________________________________________    RADIOLOGY   CXR IMPRESSION:  No active cardiopulmonary disease.      ____________________________________________   PROCEDURES  Procedures  ____________________________________________   INITIAL IMPRESSION / ASSESSMENT AND PLAN / ED COURSE  Pertinent labs & imaging results that were available during my care of the patient were reviewed by me and considered in my medical decision making (see chart for details).  Patient presents to the emergency department today because of concerns for epigastric pain. Sounds like he has had this symptom on and off for a number of months. On exam he had mild epigastric pain. Bedside ultrasound did not show any gallbladder wall thickening or gallstones. Patient stated he did think he got some relief with the gi cocktail initially. Will plan on discharging home with antacids and sucralfate as well as gi follow up. ____________________________________________   FINAL CLINICAL IMPRESSION(S) / ED DIAGNOSES  Final diagnoses:  Gastritis     Note: This dictation was prepared with Dragon dictation. Any transcriptional errors that result from this process are unintentional    Phineas SemenGraydon Lauretta Sallas, MD 05/20/16 2235

## 2016-05-20 NOTE — ED Notes (Signed)
Patient left driver's license in the room. Patient was contacted via telephone and license was left at the front desk.

## 2016-05-20 NOTE — ED Notes (Signed)
Pt reports abd pain and vomiting (2 times today) - denies diarrhea - pt reports cold sweats/chills and general body aches - sick for a couple of days

## 2017-07-20 IMAGING — CR DG CHEST 2V
1 series · 2 of 2 positions shown · non-contrast
Comparison: None.

CLINICAL DATA: Generalized body aches and abdominal pain which
started 1 week ago. Pain under rib cage at the top of the stomach.
Vomiting today.

EXAM:
CHEST  2 VIEW

[Series 1: dg chest 2 view · 0.14mm/px · 2 of 2 slices shown]
[im 1/2]
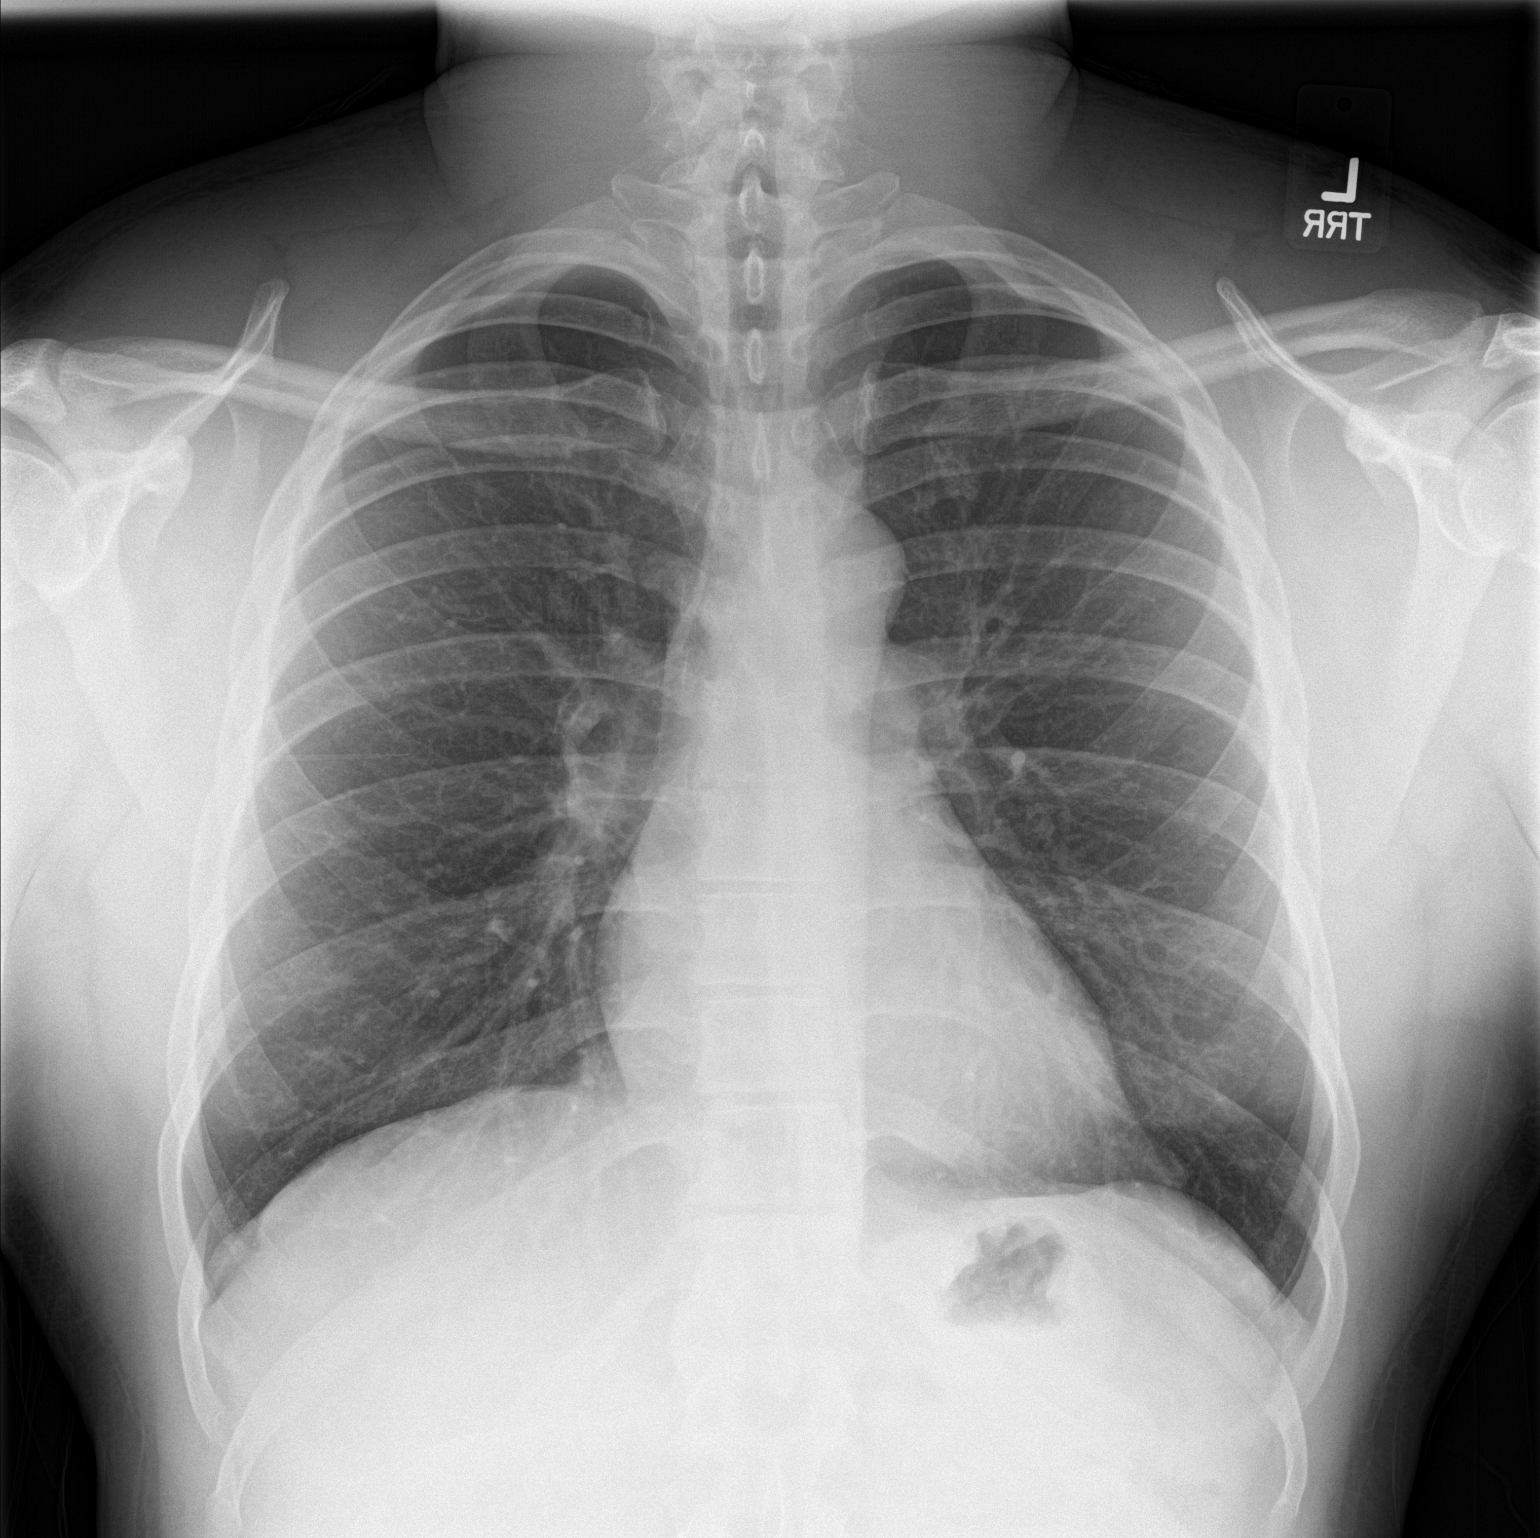
[im 2/2]
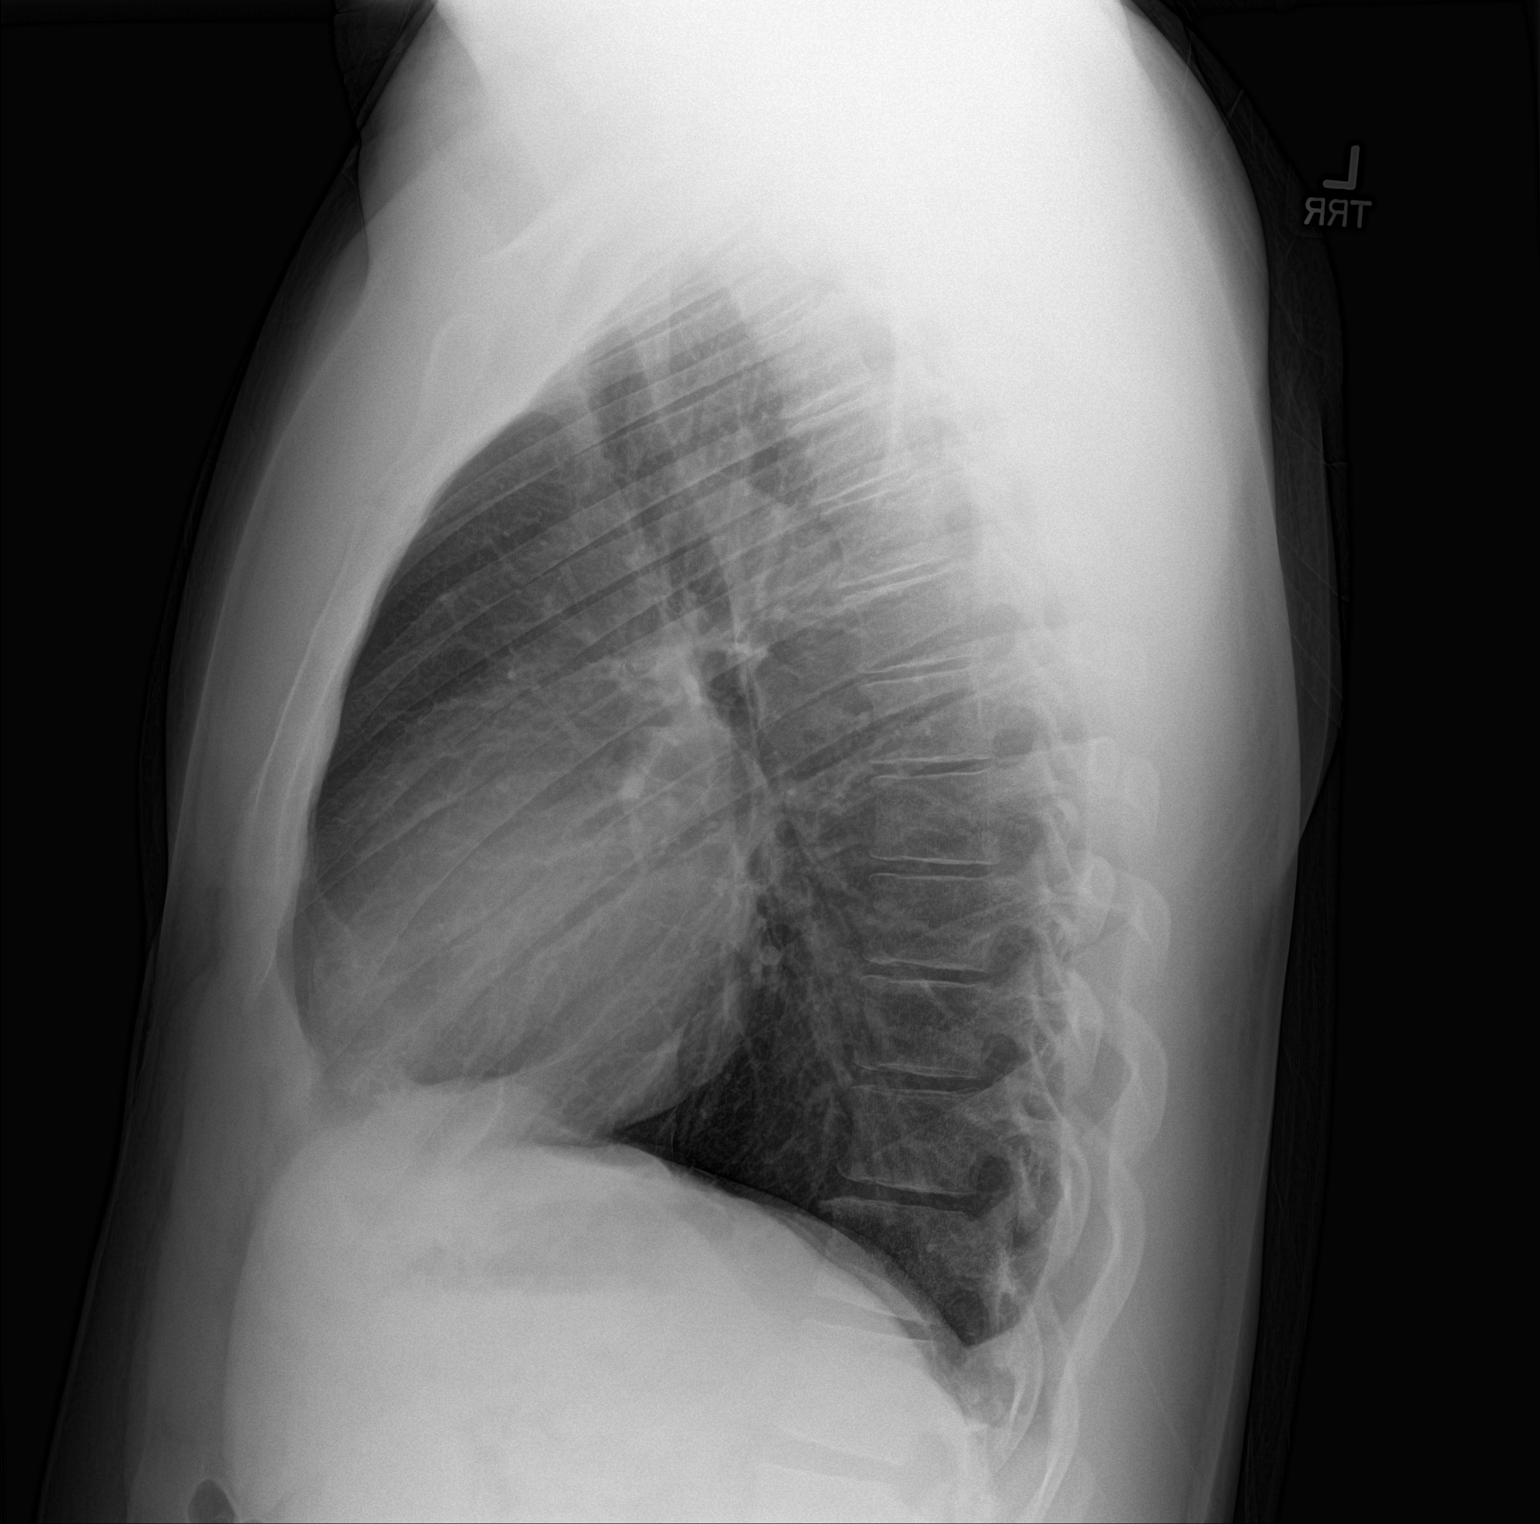

[2 of 2 positions shown; findings below may reference images not displayed]

FINDINGS: The heart size and mediastinal contours are within normal limits.
Both lungs are clear. The visualized skeletal structures are
unremarkable.
IMPRESSION: No active cardiopulmonary disease.

## 2018-07-30 ENCOUNTER — Emergency Department (HOSPITAL_COMMUNITY): Payer: Self-pay

## 2018-07-30 ENCOUNTER — Encounter (HOSPITAL_COMMUNITY): Payer: Self-pay | Admitting: Emergency Medicine

## 2018-07-30 ENCOUNTER — Other Ambulatory Visit: Payer: Self-pay

## 2018-07-30 ENCOUNTER — Emergency Department (HOSPITAL_COMMUNITY)
Admission: EM | Admit: 2018-07-30 | Discharge: 2018-07-31 | Disposition: A | Discharge status: Home / Self Care | Payer: Self-pay | Attending: Emergency Medicine | Admitting: Emergency Medicine

## 2018-07-30 DIAGNOSIS — Y939 Activity, unspecified: Secondary | ICD-10-CM | POA: Insufficient documentation

## 2018-07-30 DIAGNOSIS — S92214A Nondisplaced fracture of cuboid bone of right foot, initial encounter for closed fracture: Secondary | ICD-10-CM | POA: Insufficient documentation

## 2018-07-30 DIAGNOSIS — W109XXA Fall (on) (from) unspecified stairs and steps, initial encounter: Secondary | ICD-10-CM | POA: Insufficient documentation

## 2018-07-30 DIAGNOSIS — Y999 Unspecified external cause status: Secondary | ICD-10-CM | POA: Insufficient documentation

## 2018-07-30 DIAGNOSIS — Y929 Unspecified place or not applicable: Secondary | ICD-10-CM | POA: Insufficient documentation

## 2018-07-30 DIAGNOSIS — Z79899 Other long term (current) drug therapy: Secondary | ICD-10-CM | POA: Insufficient documentation

## 2018-07-30 DIAGNOSIS — M79671 Pain in right foot: Secondary | ICD-10-CM

## 2018-07-30 MED ORDER — IBUPROFEN 200 MG PO TABS
600.0000 mg | ORAL_TABLET | Freq: Once | ORAL | Status: AC
  Administered 2018-07-31: 600 mg via ORAL
  Filled 2018-07-30: qty 3

## 2018-07-30 NOTE — ED Triage Notes (Addendum)
Pt slipped on wet steps at home and twisted right ankle. Swelling noted. Distal pulse palpable. Pt is able to move toes. No loc. No other injuries

## 2018-07-31 MED ORDER — HYDROCODONE-ACETAMINOPHEN 5-325 MG PO TABS
2.0000 | ORAL_TABLET | Freq: Once | ORAL | Status: AC
  Administered 2018-07-31: 2 via ORAL
  Filled 2018-07-31: qty 2

## 2018-07-31 MED ORDER — IBUPROFEN 600 MG PO TABS: 600.0000 mg | ORAL_TABLET | Freq: Four times a day (QID) | ORAL | 0 refills | Status: AC | PRN

## 2018-07-31 MED ORDER — LISINOPRIL 10 MG PO TABS: 10.0000 mg | ORAL_TABLET | Freq: Every day | ORAL | 0 refills | Status: AC

## 2018-07-31 MED ORDER — HYDROCODONE-ACETAMINOPHEN 5-325 MG PO TABS: 1.0000 | ORAL_TABLET | Freq: Four times a day (QID) | ORAL | 0 refills | Status: DC | PRN

## 2018-07-31 NOTE — ED Provider Notes (Signed)
[facility]-EMERGENCY DEPT Provider Note   CSN: [id number] Arrival date & time: 07/30/18  2029     History   Chief Complaint Chief Complaint  Patient presents with  . Ankle Pain    right    HPI Bruce Becker is a 32 y.o. male with history of hypertension who presents with right foot and ankle pain after slipping on wet steps earlier this evening.  He twisted his foot and ankle.  He has had associated swelling and has not been able to bear weight since.  He used ice prior to arrival.  No medications taken.  He denies any other pain or injuries.  He did not hit his head or lose consciousness.  HPI  Past Medical History:  Diagnosis Date  . Hypertension     There are no active problems to display for this patient.   History reviewed. No pertinent surgical history.      Home Medications    Prior to Admission medications   Medication Sig Start Date End Date Taking? Authorizing Provider  HYDROcodone-acetaminophen (NORCO/VICODIN) 5-325 MG tablet Take 1-2 tablets by mouth every 6 (six) hours as needed. 07/31/18   Shontell Prosser, Waylan Boga, PA-C  ibuprofen (ADVIL,MOTRIN) 600 MG tablet Take 1 tablet (600 mg total) by mouth every 6 (six) hours as needed. 07/31/18   Taralyn Ferraiolo, Waylan Boga, PA-C  lisinopril (PRINIVIL,ZESTRIL) 10 MG tablet Take 1 tablet (10 mg total) by mouth daily. 07/31/18   Marshella Tello, Waylan Boga, PA-C  ranitidine (ZANTAC) 150 MG tablet Take 1 tablet (150 mg total) by mouth 2 (two) times daily. 05/20/16 05/20/17  Phineas Semen, MD  sucralfate (CARAFATE) 1 g tablet Take 1 tablet (1 g total) by mouth 4 (four) times daily. 05/20/16   Phineas Semen, MD    Family History No family history on file.  Social History Social History   Tobacco Use  . Smoking status: Never Smoker  . Smokeless tobacco: Never Used  Substance Use Topics  . Alcohol use: No  . Drug use: No     Allergies   Patient has no known allergies.   Review of Systems Review of Systems    Musculoskeletal: Positive for arthralgias and joint swelling.  Neurological: Positive for numbness (pateint does note some "pins and needles"). Negative for syncope.     Physical Exam Updated Vital Signs BP (!) 170/105 (BP Location: Left Arm)   Pulse 77   Temp 97.9 F (36.6 C) (Oral)   Resp 18   SpO2 97%   Physical Exam  Constitutional: He appears well-developed and well-nourished. No distress.  HENT:  Head: Normocephalic and atraumatic.  Mouth/Throat: Oropharynx is clear and moist. No oropharyngeal exudate.  Eyes: Pupils are equal, round, and reactive to light. Conjunctivae are normal. Right eye exhibits no discharge. Left eye exhibits no discharge. No scleral icterus.  Neck: Normal range of motion. Neck supple. No thyromegaly present.  Cardiovascular: Normal rate, regular rhythm, normal heart sounds and intact distal pulses. Exam reveals no gallop and no friction rub.  No murmur heard. Pulmonary/Chest: Effort normal and breath sounds normal. No stridor. No respiratory distress. He has no wheezes. He has no rales.  Musculoskeletal: He exhibits no edema.       Right ankle: He exhibits decreased range of motion and swelling. He exhibits normal pulse. Tenderness. Head of 5th metatarsal tenderness found. No lateral malleolus, no medial malleolus and no proximal fibula tenderness found. Achilles tendon normal.       Feet:  Right foot:  Plantar and dorsiflexion intact, however limited due to pain and swelling  Lymphadenopathy:    He has no cervical adenopathy.  Neurological: He is alert. Coordination normal.  Skin: Skin is warm and dry. No rash noted. He is not diaphoretic. No pallor.  Psychiatric: He has a normal mood and affect.  Nursing note and vitals reviewed.    ED Treatments / Results  Labs (all labs ordered are listed, but only abnormal results are displayed) Labs Reviewed - No data to display  EKG None  Radiology Dg Ankle Complete Right  Result Date:  07/30/2018 CLINICAL DATA:  Pt slipped on wet steps at home earlier this evening and twisted right ankle. Swelling noted. EXAM: RIGHT ANKLE - COMPLETE 3+ VIEW COMPARISON:  None. FINDINGS: There is no evidence of fracture, dislocation, or joint effusion. There is no evidence of arthropathy or other focal bone abnormality. Soft tissues are unremarkable. IMPRESSION: Negative. Electronically Signed   By: Corlis Leak M.D.   On: 07/30/2018 22:04   Dg Foot Complete Right  Result Date: 07/31/2018 CLINICAL DATA:  32 year old male with fall and right foot pain. EXAM: RIGHT FOOT COMPLETE - 3+ VIEW COMPARISON:  Right ankle radiograph dated 07/30/2018 FINDINGS: Tiny bone fragment along the plantar aspect of the cuboid is age indeterminate but an acute avulsion fracture is not excluded. Clinical correlation is recommended. No other acute fracture identified. There is no dislocation. There is hallux valgus. The soft tissues are unremarkable. IMPRESSION: Age indeterminate avulsion injury of the plantar aspect of the cuboid. Clinical correlation is recommended. Electronically Signed   By: Elgie Collard M.D.   On: 07/31/2018 00:54    Procedures Procedures (including critical care time)  Medications Ordered in ED Medications  ibuprofen (ADVIL,MOTRIN) tablet 600 mg (600 mg Oral Given 07/31/18 0057)  HYDROcodone-acetaminophen (NORCO/VICODIN) 5-325 MG per tablet 2 tablet (2 tablets Oral Given 07/31/18 0122)     Initial Impression / Assessment and Plan / ED Course  I have reviewed the triage vital signs and the nursing notes.  Pertinent labs & imaging results that were available during my care of the patient were reviewed by me and considered in my medical decision making (see chart for details).     Patient with right ankle and foot pain after slipping on wet steps.  Ankle x-ray is negative.  Foot x-ray shows age-indeterminate avulsion injury of the plantar aspect of the cuboid.  Suspect this is acute.  Patient  placed in ASO and given crutches for nonweightbearing.  Patient will be referred to orthopedics for further management.  Supportive treatment discussed including ice, elevation.  Patient had no relief of pain with ibuprofen in the ED.  He was given Norco.  Will be discharged home with same for severe pain.  I reviewed the [place] narcotic database and found no discrepancies.  Patient reports he is supposed to be on medication for hypertension and used to be on lisinopril 10 mg a few years ago.  I will refill this.  Patient advised to follow-up and establish care with a PCP for further management of this and subsequent refills.  Return precautions discussed.  Patient understands and agrees with plan.  Patient discharged in satisfactory condition.  Final Clinical Impressions(s) / ED Diagnoses   Final diagnoses:  Right foot pain  Closed nondisplaced fracture of cuboid of right foot, initial encounter    ED Discharge Orders         Ordered    lisinopril (PRINIVIL,ZESTRIL) 10 MG tablet  Daily  07/31/18 0129    ibuprofen (ADVIL,MOTRIN) 600 MG tablet  Every 6 hours PRN     07/31/18 0129    HYDROcodone-acetaminophen (NORCO/VICODIN) 5-325 MG tablet  Every 6 hours PRN     07/31/18 0129           Emi Holes, PA-C 07/31/18 0728    Ward, Layla Maw, DO 07/31/18 4098

## 2018-07-31 NOTE — Discharge Instructions (Addendum)
You have a severe ankle sprain with an avulsion fracture of your cuboid in your right foot.  Take ibuprofen every 6 hours as needed for pain.  For very severe pain, you can take 1-2 Norco every 6 hours.  Use ice 3-4 times daily alternating 20 minutes on, 20 minutes off.  Keep your splint on at all times except when bathing.  Ambulate with crutches at all times in the beginning and he can progress your weightbearing as tolerated.  Please follow-up with the orthopedic doctor if your symptoms are not improving over the next 7 to 10 days.  Please return the emergency department if you develop any new or worsening symptoms.  Please establish care with a primary care provider for further management of your blood pressure.  Resume taking lisinopril as prescribed.  Do not drink alcohol, drive, operate machinery or participate in any other potentially dangerous activities while taking opiate pain medication as it may make you sleepy. Do not take this medication with any other sedating medications, either prescription or over-the-counter. If you were prescribed Percocet or Vicodin, do not take these with acetaminophen (Tylenol) as it is already contained within these medications and overdose of Tylenol is dangerous.   This medication is an opiate (or narcotic) pain medication and can be habit forming.  Use it as little as possible to achieve adequate pain control.  Do not use or use it with extreme caution if you have a history of opiate abuse or dependence. This medication is intended for your use only - do not give any to anyone else and keep it in a secure place where nobody else, especially children, have access to it. It will also cause or worsen constipation, so you may want to consider taking an over-the-counter stool softener while you are taking this medication.

## 2019-02-08 ENCOUNTER — Encounter: Payer: Self-pay | Admitting: *Deleted

## 2019-02-08 ENCOUNTER — Emergency Department
Admission: EM | Admit: 2019-02-08 | Discharge: 2019-02-08 | Disposition: A | Discharge status: Home / Self Care | Payer: HRSA Program | Attending: Student in an Organized Health Care Education/Training Program | Admitting: Student in an Organized Health Care Education/Training Program

## 2019-02-08 ENCOUNTER — Other Ambulatory Visit: Payer: Self-pay

## 2019-02-08 DIAGNOSIS — J020 Streptococcal pharyngitis: Secondary | ICD-10-CM | POA: Diagnosis not present

## 2019-02-08 DIAGNOSIS — R070 Pain in throat: Secondary | ICD-10-CM | POA: Diagnosis present

## 2019-02-08 DIAGNOSIS — I10 Essential (primary) hypertension: Secondary | ICD-10-CM | POA: Diagnosis not present

## 2019-02-08 DIAGNOSIS — F17200 Nicotine dependence, unspecified, uncomplicated: Secondary | ICD-10-CM | POA: Diagnosis not present

## 2019-02-08 DIAGNOSIS — Z79899 Other long term (current) drug therapy: Secondary | ICD-10-CM | POA: Diagnosis not present

## 2019-02-08 DIAGNOSIS — U071 COVID-19: Secondary | ICD-10-CM | POA: Insufficient documentation

## 2019-02-08 LAB — GROUP A STREP BY PCR: Group A Strep by PCR: DETECTED — AB

## 2019-02-08 MED ORDER — AMOXICILLIN 500 MG PO CAPS: 500.0000 mg | ORAL_CAPSULE | Freq: Three times a day (TID) | ORAL | 0 refills | Status: AC

## 2019-02-08 MED ORDER — AMOXICILLIN 500 MG PO CAPS
500.0000 mg | ORAL_CAPSULE | Freq: Once | ORAL | Status: AC
  Administered 2019-02-08: 21:00:00 500 mg via ORAL
  Filled 2019-02-08: qty 1

## 2019-02-08 MED ORDER — DEXAMETHASONE 4 MG PO TABS
10.0000 mg | ORAL_TABLET | Freq: Once | ORAL | Status: AC
  Administered 2019-02-08: 20:00:00 10 mg via ORAL
  Filled 2019-02-08: qty 2.5

## 2019-02-08 MED ORDER — AMOXICILLIN 500 MG PO CAPS: 500.0000 mg | ORAL_CAPSULE | Freq: Three times a day (TID) | ORAL | 0 refills | Status: DC

## 2019-02-08 NOTE — ED Provider Notes (Signed)
[facility] Emergency Department Provider Note    First MD Initiated Contact with Patient 02/08/19 1944     (approximate)  I have reviewed the triage vital signs and the nursing notes.   HISTORY  Chief Complaint Sore Throat    HPI Bruce Becker is a 33 y.o. male 4 hours of sore throat difficulty swallowing.  No change in phonation.  States his girlfriend is a Engineer, civil (consulting)nurse looked at his throat and said it looked like strep throat so sent to the ER.  No known sick contacts.  No chest pain shortness of breath.  No congestion.  Does feel like he is got some swollen lymph nodes.  No cough.    Past Medical History:  Diagnosis Date  . Hypertension    No family history on file. No past surgical history on file. There are no active problems to display for this patient.     Prior to Admission medications   Medication Sig Start Date End Date Taking? Authorizing Provider  amoxicillin (AMOXIL) 500 MG capsule Take 1 capsule (500 mg total) by mouth 3 (three) times daily for 5 days. 02/08/19 02/13/19  Willy Eddyobinson, Maritssa Haughton, MD  HYDROcodone-acetaminophen (NORCO/VICODIN) 5-325 MG tablet Take 1-2 tablets by mouth every 6 (six) hours as needed. 07/31/18   Law, Waylan BogaAlexandra M, PA-C  ibuprofen (ADVIL,MOTRIN) 600 MG tablet Take 1 tablet (600 mg total) by mouth every 6 (six) hours as needed. 07/31/18   Law, Waylan BogaAlexandra M, PA-C  lisinopril (PRINIVIL,ZESTRIL) 10 MG tablet Take 1 tablet (10 mg total) by mouth daily. 07/31/18   Law, Waylan BogaAlexandra M, PA-C  ranitidine (ZANTAC) 150 MG tablet Take 1 tablet (150 mg total) by mouth 2 (two) times daily. 05/20/16 05/20/17  Phineas SemenGoodman, Graydon, MD  sucralfate (CARAFATE) 1 g tablet Take 1 tablet (1 g total) by mouth 4 (four) times daily. 05/20/16   Phineas SemenGoodman, Graydon, MD    Allergies Patient has no known allergies.    Social History Social History   Tobacco Use  . Smoking status: Current Every Day Smoker  . Smokeless tobacco: Never Used  Substance Use  Topics  . Alcohol use: Yes  . Drug use: No    Review of Systems Patient denies headaches, rhinorrhea, blurry vision, numbness, shortness of breath, chest pain, edema, cough, abdominal pain, nausea, vomiting, diarrhea, dysuria, fevers, rashes or hallucinations unless otherwise stated above in HPI. ____________________________________________   PHYSICAL EXAM:  VITAL SIGNS: Vitals:   02/08/19 1945 02/08/19 2046  BP: (!) 155/108   Pulse: 85   Resp: 18   Temp:  98.6 F (37 C)  SpO2: 100%     Constitutional: Alert and oriented.  Eyes: Conjunctivae are normal.  Head: Atraumatic. Nose: No congestion/rhinnorhea. Mouth/Throat: Mucous membranes are moist.  Uvular and pharyngeal erythema without exudates.  Uvula is midline.  No muffled voice.  No evidence of PTA or RPA. Neck: No stridor. Painless ROM.  Cardiovascular: Normal rate, regular rhythm. Grossly normal heart sounds.  Good peripheral circulation. Respiratory: Normal respiratory effort.  No retractions. Lungs CTAB. Gastrointestinal: Soft and nontender. No distention. No abdominal bruits. No CVA tenderness. Genitourinary:  Musculoskeletal: No lower extremity tenderness nor edema.  No joint effusions. Neurologic:  Normal speech and language. No gross focal neurologic deficits are appreciated. No facial droop Skin:  Skin is warm, dry and intact. No rash noted. Psychiatric: Mood and affect are normal. Speech and behavior are normal.  ____________________________________________   LABS (all labs ordered are listed, but only abnormal results are displayed)  Results for orders placed or performed during the hospital encounter of 02/08/19 (from the past 24 hour(s))  Group A Strep by PCR ([facility] Only)     Status: Abnormal   Collection Time: 02/08/19  7:57 PM  Result Value Ref Range   Group A Strep by PCR DETECTED (A) NOT DETECTED   ____________________________________________ ____________________________________________  RADIOLOGY    ____________________________________________   PROCEDURES  Procedure(s) performed:  Procedures    Critical Care performed: no ____________________________________________   INITIAL IMPRESSION / ASSESSMENT AND PLAN / ED COURSE  Pertinent labs & imaging results that were available during my care of the patient were reviewed by me and considered in my medical decision making (see chart for details).   DDX: uri, pharyngitis, strep, covid  [name] is a 33 y.o. who presents to the ED with sore throat as described above.  Not consistent with PTA or RPA.  Findings concerning for strep throat.  Will start on antibiotics as well as give Decadron.     The patient was evaluated in Emergency Department today for the symptoms described in the history of present illness. He/she was evaluated in the context of the global COVID-19 pandemic, which necessitated consideration that the patient might be at risk for infection with the SARS-CoV-2 virus that causes COVID-19. Institutional protocols and algorithms that pertain to the evaluation of patients at risk for COVID-19 are in a state of rapid change based on information released by regulatory bodies including the CDC and federal and state organizations. These policies and algorithms were followed during the patient's care in the ED.  As part of my medical decision making, I reviewed the following data within the electronic MEDICAL RECORD NUMBER Nursing notes reviewed and incorporated, Labs reviewed, notes from prior ED visits and [place] Controlled Substance Database   ____________________________________________   FINAL CLINICAL IMPRESSION(S) / ED DIAGNOSES  Final diagnoses:  Pharyngitis due to Streptococcus species      NEW MEDICATIONS STARTED DURING THIS VISIT:  Current Discharge Medication List    START taking these medications   Details  amoxicillin (AMOXIL) 500 MG capsule Take 1 capsule (500 mg total) by mouth 3 (three) times  daily for 5 days. Qty: 15 capsule, Refills: 0         Note:  This document was prepared using Dragon voice recognition software and may include unintentional dictation errors.    Willy Eddy, MD 02/08/19 2051

## 2019-02-08 NOTE — ED Triage Notes (Signed)
Pt reports sore throat since yesterday.  Denies cough, sob, fever.  Pt alert.

## 2019-02-10 LAB — NOVEL CORONAVIRUS, NAA (HOSP ORDER, SEND-OUT TO REF LAB; TAT 18-24 HRS): SARS-CoV-2, NAA: NOT DETECTED

## 2019-02-11 ENCOUNTER — Telehealth: Payer: Self-pay | Admitting: Emergency Medicine

## 2019-02-11 NOTE — Telephone Encounter (Signed)
Called patient to inform of negative covid 19 test result.  No answer and then phone cut off.

## 2020-06-30 ENCOUNTER — Ambulatory Visit
Admission: RE | Admit: 2020-06-30 | Discharge: 2020-06-30 | Disposition: A | Discharge status: Home / Self Care | Payer: Self-pay | Source: Ambulatory Visit | Attending: Family Medicine | Admitting: Family Medicine

## 2020-06-30 ENCOUNTER — Ambulatory Visit
Admission: RE | Admit: 2020-06-30 | Discharge: 2020-06-30 | Disposition: A | Discharge status: Home / Self Care | Payer: Self-pay | Attending: Family Medicine | Admitting: Family Medicine

## 2020-06-30 ENCOUNTER — Other Ambulatory Visit: Payer: Self-pay

## 2020-06-30 ENCOUNTER — Ambulatory Visit
Admission: EM | Admit: 2020-06-30 | Discharge: 2020-06-30 | Disposition: A | Discharge status: Home / Self Care | Payer: Self-pay | Attending: Family Medicine | Admitting: Family Medicine

## 2020-06-30 DIAGNOSIS — M25532 Pain in left wrist: Secondary | ICD-10-CM

## 2020-06-30 DIAGNOSIS — M79642 Pain in left hand: Secondary | ICD-10-CM

## 2020-06-30 DIAGNOSIS — M25649 Stiffness of unspecified hand, not elsewhere classified: Secondary | ICD-10-CM | POA: Insufficient documentation

## 2020-06-30 DIAGNOSIS — S60222A Contusion of left hand, initial encounter: Secondary | ICD-10-CM | POA: Insufficient documentation

## 2020-06-30 DIAGNOSIS — Y29XXXA Contact with blunt object, undetermined intent, initial encounter: Secondary | ICD-10-CM | POA: Insufficient documentation

## 2020-06-30 DIAGNOSIS — M25432 Effusion, left wrist: Secondary | ICD-10-CM

## 2020-06-30 DIAGNOSIS — M25542 Pain in joints of left hand: Secondary | ICD-10-CM | POA: Insufficient documentation

## 2020-06-30 MED ORDER — CYCLOBENZAPRINE HCL 10 MG PO TABS: 10.0000 mg | ORAL_TABLET | Freq: Two times a day (BID) | ORAL | 0 refills | Status: AC | PRN

## 2020-06-30 MED ORDER — HYDROCODONE-ACETAMINOPHEN 5-325 MG PO TABS: 1.0000 | ORAL_TABLET | Freq: Four times a day (QID) | ORAL | 0 refills | Status: AC | PRN

## 2020-06-30 NOTE — ED Provider Notes (Signed)
[facility]   [id number] 06/30/20 Arrival Time: 1516  WF:[id number] PAIN  SUBJECTIVE: History from: patient. Bruce Becker is a 34 y.o. male complains of left hand and wrist pain that began yesterday. Reports that he was moving furniture and his hand on the edge of the truck. Reports swelling and limited ROM. Has not taken OTC medications for this. Denies a precipitating event or specific injury.  Describes the pain as constant and achy in character. Symptoms are made worse with activity.  Denies similar symptoms in the past.  Denies fever, chills, erythema, ecchymosis, effusion, weakness, numbness and tingling, saddle paresthesias, loss of bowel or bladder function.      ROS: As per HPI.  All other pertinent ROS negative.     Past Medical History:  Diagnosis Date  . Hypertension    History reviewed. No pertinent surgical history. No Known Allergies No current facility-administered medications on file prior to encounter.   Current Outpatient Medications on File Prior to Encounter  Medication Sig Dispense Refill  . lisinopril (PRINIVIL,ZESTRIL) 10 MG tablet Take 1 tablet (10 mg total) by mouth daily. 30 tablet 0  . ibuprofen (ADVIL,MOTRIN) 600 MG tablet Take 1 tablet (600 mg total) by mouth every 6 (six) hours as needed. 30 tablet 0  . ranitidine (ZANTAC) 150 MG tablet Take 1 tablet (150 mg total) by mouth 2 (two) times daily. 60 tablet 1  . sucralfate (CARAFATE) 1 g tablet Take 1 tablet (1 g total) by mouth 4 (four) times daily. 60 tablet 0   Social History   Socioeconomic History  . Marital status: Single    Spouse name: Not on file  . Number of children: Not on file  . Years of education: Not on file  . Highest education level: Not on file  Occupational History  . Not on file  Tobacco Use  . Smoking status: Current Every Day Smoker  . Smokeless tobacco: Never Used  Substance and Sexual Activity  . Alcohol use: Yes  . Drug use: No  . Sexual activity: Not on file    Other Topics Concern  . Not on file  Social History Narrative  . Not on file   Social Determinants of Health   Financial Resource Strain:   . Difficulty of Paying Living Expenses: Not on file  Food Insecurity:   . Worried About Programme researcher, broadcasting/film/video in the Last Year: Not on file  . Ran Out of Food in the Last Year: Not on file  Transportation Needs:   . Lack of Transportation (Medical): Not on file  . Lack of Transportation (Non-Medical): Not on file  Physical Activity:   . Days of Exercise per Week: Not on file  . Minutes of Exercise per Session: Not on file  Stress:   . Feeling of Stress : Not on file  Social Connections:   . Frequency of Communication with Friends and Family: Not on file  . Frequency of Social Gatherings with Friends and Family: Not on file  . Attends Religious Services: Not on file  . Active Member of Clubs or Organizations: Not on file  . Attends Banker Meetings: Not on file  . Marital Status: Not on file  Intimate Partner Violence:   . Fear of Current or Ex-Partner: Not on file  . Emotionally Abused: Not on file  . Physically Abused: Not on file  . Sexually Abused: Not on file   History reviewed. No pertinent family history.  OBJECTIVE:  Vitals:  06/30/20 1612  BP: (!) 158/102  Pulse: 86  Resp: 16  Temp: 98.9 F (37.2 C)  SpO2: 96%    General appearance: ALERT; in no acute distress.  Head: NCAT Lungs: Normal respiratory effort CV: pulses 2+ bilaterally. Cap refill < 2 seconds Musculoskeletal:  Inspection: Skin warm, dry, clear and intact.Erythema to dorsal surface of left hand with swelling, about 3x3 cm in diameter Palpation: dorsal surface of left hand very tender to palpation ROM: limited ROM active and passive to left hand and wrist Skin: warm and dry Neurologic: Ambulates without difficulty; Sensation intact about the upper/ lower extremities Psychological: alert and cooperative; normal mood and affect  DIAGNOSTIC  STUDIES:  DG Wrist Complete Left  Result Date: 06/30/2020 CLINICAL DATA:  Pain and swelling after injury. Limited range of motion. EXAM: LEFT WRIST - COMPLETE 3+ VIEW COMPARISON:  None. FINDINGS: There is no evidence of fracture or dislocation. There is no evidence of arthropathy or other focal bone abnormality. Normal carpal bone alignment. Metacarpals are intact. Dorsal soft tissue edema. IMPRESSION: Dorsal soft tissue edema without acute fracture or dislocation. Electronically Signed   By: Narda Rutherford M.D.   On: 06/30/2020 16:53     ASSESSMENT & PLAN:  1. Left hand pain   2. Pain and swelling of left wrist     Meds ordered this encounter  Medications  . HYDROcodone-acetaminophen (NORCO/VICODIN) 5-325 MG tablet    Sig: Take 1 tablet by mouth every 6 (six) hours as needed for up to 3 days for moderate pain or severe pain.    Dispense:  10 tablet    Refill:  0    Order Specific Question:   Supervising Provider    Answer:   Merrilee Jansky [id number]  . cyclobenzaprine (FLEXERIL) 10 MG tablet    Sig: Take 1 tablet (10 mg total) by mouth 2 (two) times daily as needed for muscle spasms.    Dispense:  20 tablet    Refill:  0    Order Specific Question:   Supervising Provider    Answer:   Merrilee Jansky [id number]   Radial gutter splint placed in office Continue conservative management of rest, ice, and gentle stretches Take ibuprofen as needed for pain relief (may cause abdominal discomfort, ulcers, and GI bleeds avoid taking with other NSAIDs) Take norco as directed for pain Take cyclobenzaprine at nighttime for symptomatic relief. Avoid driving or operating heavy machinery while using medication. Follow up with orthopedics tomorrow Highly suspicious for soft tissue injury with negative xray Return or go to the ER if you have any new or worsening symptoms (fever, chills, chest pain, abdominal pain, changes in bowel or bladder habits, pain radiating into lower legs)   [place]  Controlled Substances Registry consulted for this patient. I feel the risk/benefit ratio today is favorable for proceeding with this prescription for a controlled substance. Medication sedation precautions given.  Reviewed expectations re: course of current medical issues. Questions answered. Outlined signs and symptoms indicating need for more acute intervention. Patient verbalized understanding. After Visit Summary given.       Moshe Cipro, NP 07/01/20 1022

## 2020-06-30 NOTE — ED Notes (Signed)
Applied radial gunner splint to left upper extremity.

## 2020-06-30 NOTE — Discharge Instructions (Addendum)
Your xray is negative for any fractures or misalignments.  We have applied a splint in the office.    You may take ibuprofen every 8 hours as needed for pain.    You may also take Flexeril as needed for spasms. This medication may make you sleepy.  Do not drive or operate heavy machinery while taking this medication.  Follow up with orthopedics  If you are having acute worsening symptoms, unrelenting pain, high fever, trouble swallowing, trouble breathing follow-up with the ER.

## 2020-06-30 NOTE — ED Triage Notes (Signed)
Patient reports he was moving this morning when he hit the radial aspect of his left wrist on the back of a moving truck. Reports 10/10 pain and inability to move wrist. Patient is able to move fingers, but reports some discomfort, especially when moving his index and middle fingers as well as his thumb.

## 2021-08-30 IMAGING — CR DG WRIST COMPLETE 3+V*L*
1 series · 4 of 4 positions shown · non-contrast
Comparison: None.

CLINICAL DATA: Pain and swelling after injury. Limited range of
motion.

EXAM:
LEFT WRIST - COMPLETE 3+ VIEW

[Series 1: dg wrist complete left · 0.14mm/px · 4 of 4 slices shown]
[im 1/4]
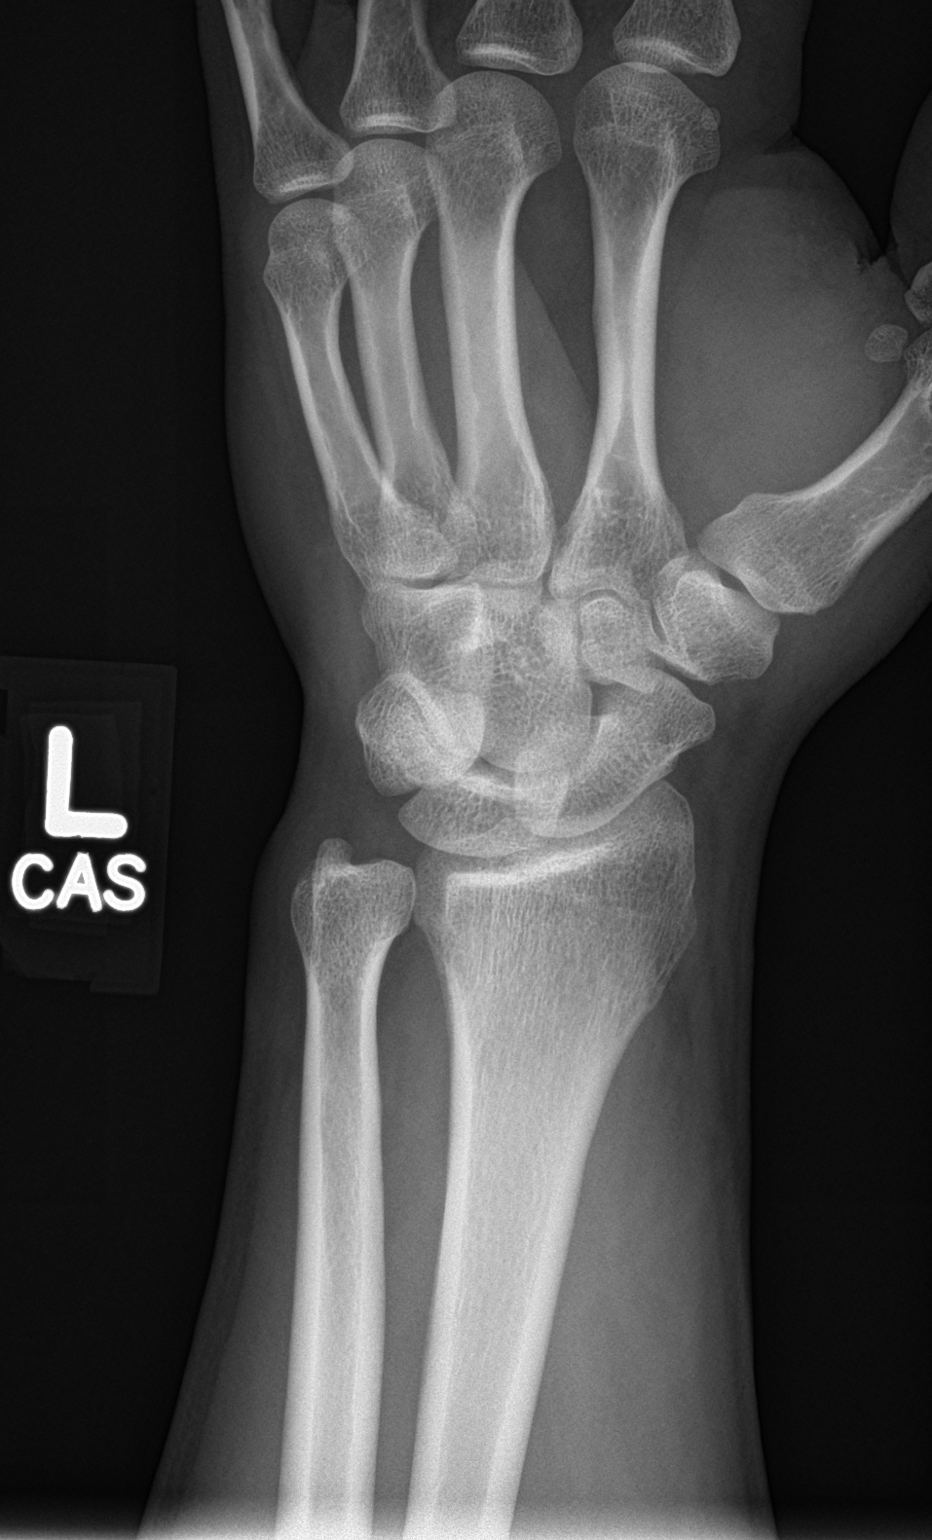
[im 2/4]
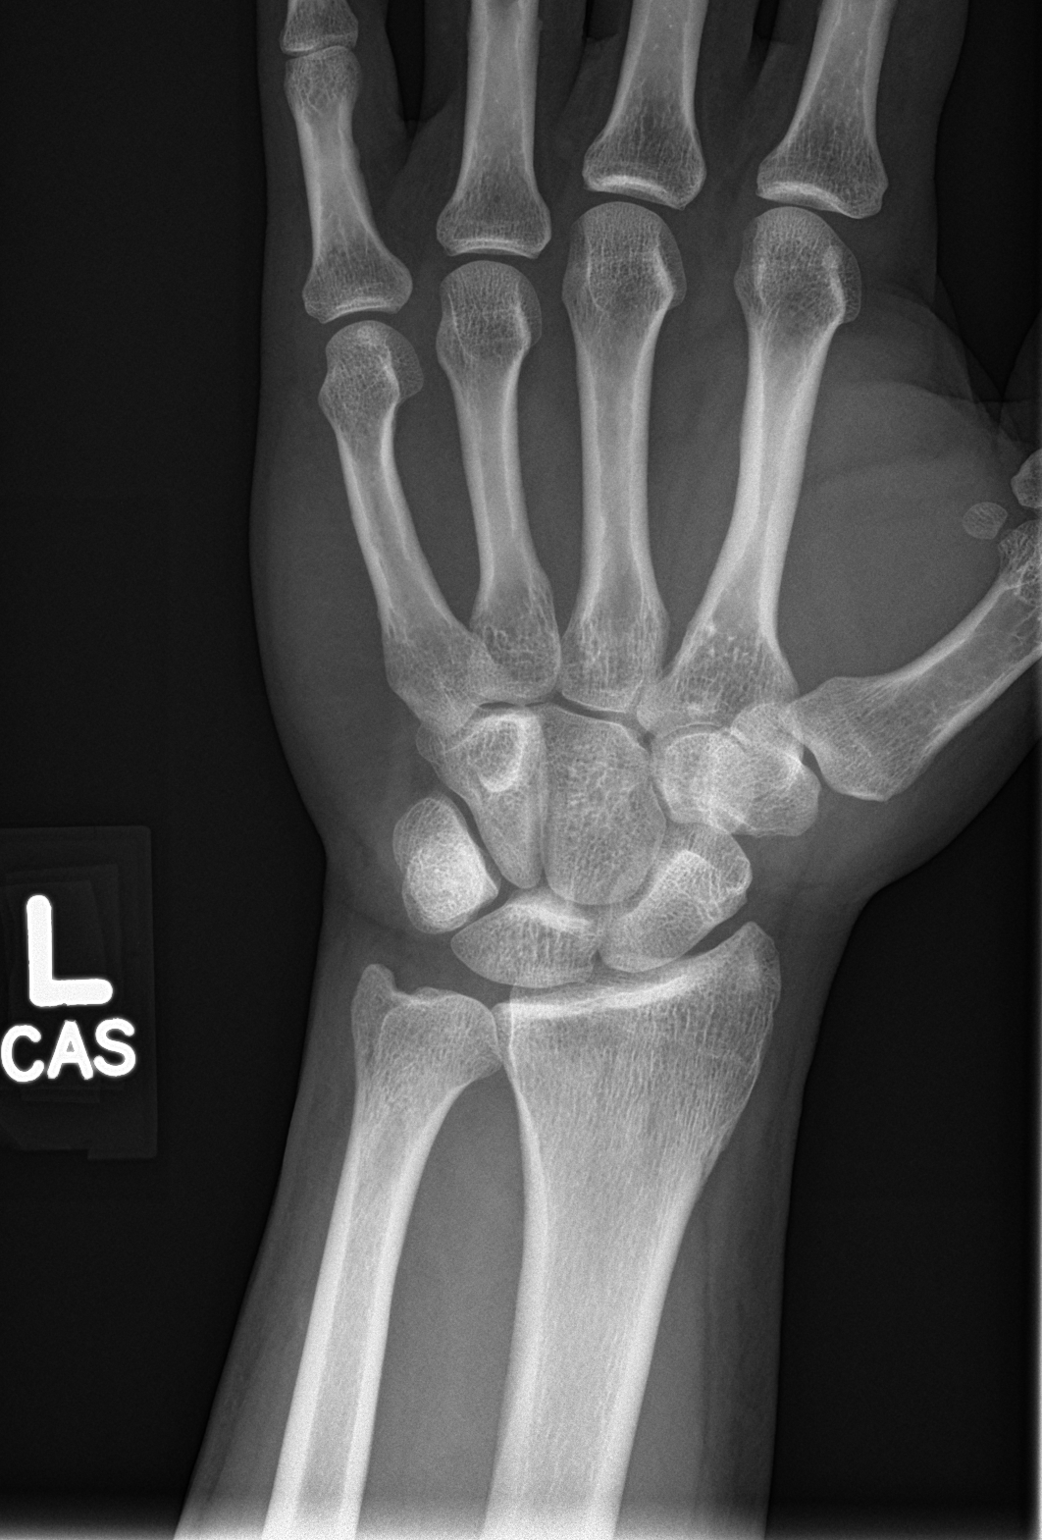
[im 3/4]
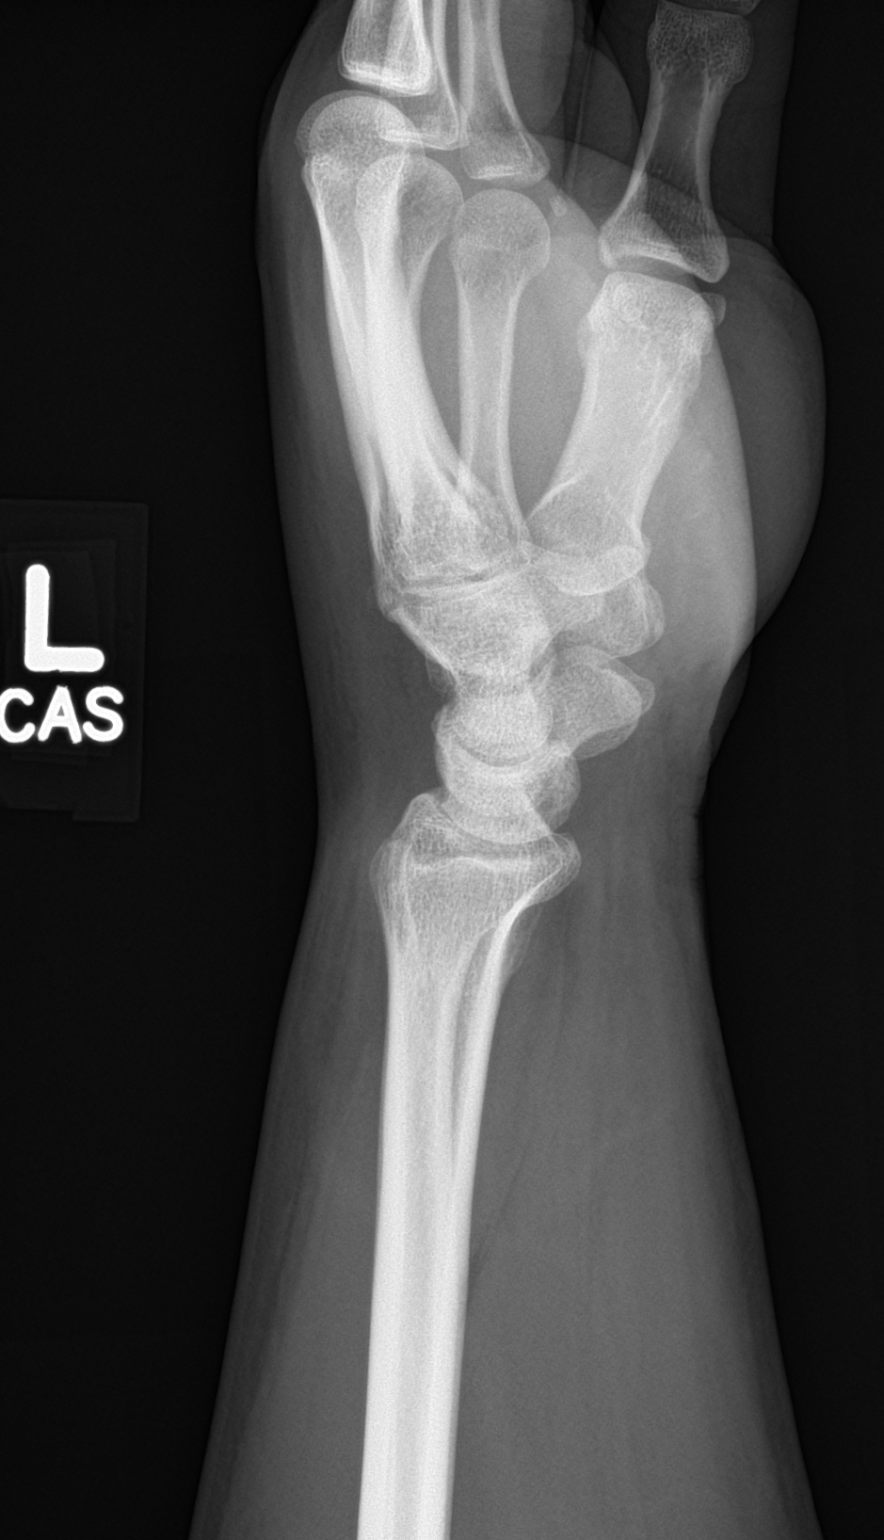
[im 4/4]
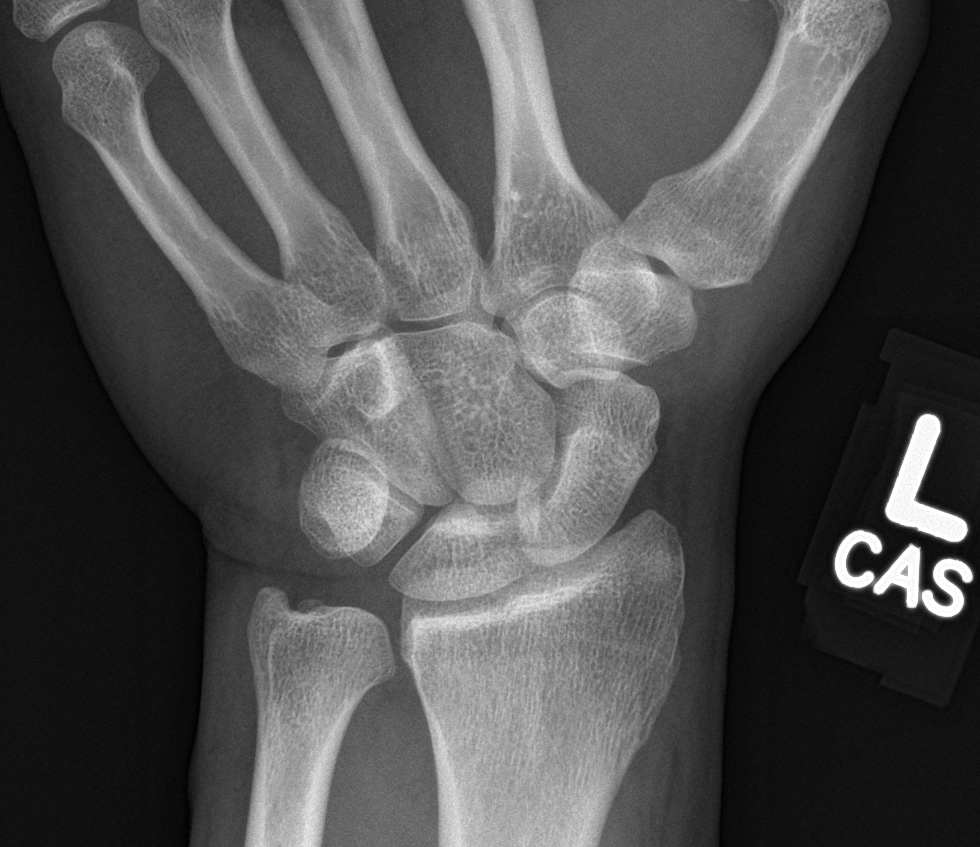

[4 of 4 positions shown; findings below may reference images not displayed]

FINDINGS: There is no evidence of fracture or dislocation. There is no
evidence of arthropathy or other focal bone abnormality. Normal
carpal bone alignment. Metacarpals are intact. Dorsal soft tissue
edema.
IMPRESSION: Dorsal soft tissue edema without acute fracture or dislocation.
